# Patient Record
Sex: Female | Born: 1980 | Race: White | Hispanic: No | Smoking: Current every day smoker
Health system: Southern US, Community
[De-identification: ages and names within clinical notes are randomized; demographics above are authoritative.]

## PROBLEM LIST (undated history)

## (undated) DIAGNOSIS — D649 Anemia, unspecified: Secondary | ICD-10-CM

## (undated) DIAGNOSIS — R110 Nausea: Secondary | ICD-10-CM

## (undated) DIAGNOSIS — N643 Galactorrhea not associated with childbirth: Secondary | ICD-10-CM

## (undated) DIAGNOSIS — R87619 Unspecified abnormal cytological findings in specimens from cervix uteri: Secondary | ICD-10-CM

## (undated) DIAGNOSIS — F329 Major depressive disorder, single episode, unspecified: Secondary | ICD-10-CM

## (undated) DIAGNOSIS — D352 Benign neoplasm of pituitary gland: Secondary | ICD-10-CM

## (undated) DIAGNOSIS — IMO0002 Reserved for concepts with insufficient information to code with codable children: Secondary | ICD-10-CM

## (undated) DIAGNOSIS — F319 Bipolar disorder, unspecified: Secondary | ICD-10-CM

## (undated) DIAGNOSIS — G43909 Migraine, unspecified, not intractable, without status migrainosus: Secondary | ICD-10-CM

## (undated) DIAGNOSIS — F32A Depression, unspecified: Secondary | ICD-10-CM

## (undated) HISTORY — DX: Major depressive disorder, single episode, unspecified: F32.9

## (undated) HISTORY — PX: LEEP: SHX91

## (undated) HISTORY — DX: Galactorrhea not associated with childbirth: N64.3

## (undated) HISTORY — DX: Anemia, unspecified: D64.9

## (undated) HISTORY — PX: APPENDECTOMY: SHX54

## (undated) HISTORY — DX: Benign neoplasm of pituitary gland: D35.2

## (undated) HISTORY — DX: Reserved for concepts with insufficient information to code with codable children: IMO0002

## (undated) HISTORY — PX: TUBAL LIGATION: SHX77

## (undated) HISTORY — DX: Depression, unspecified: F32.A

## (undated) HISTORY — DX: Unspecified abnormal cytological findings in specimens from cervix uteri: R87.619

## (undated) HISTORY — DX: Nausea: R11.0

## (undated) HISTORY — DX: Bipolar disorder, unspecified: F31.9

## (undated) HISTORY — DX: Migraine, unspecified, not intractable, without status migrainosus: G43.909

---
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
## 1999-08-17 ENCOUNTER — Inpatient Hospital Stay (HOSPITAL_COMMUNITY): Admission: EM | Admit: 1999-08-17 | Discharge: 1999-08-19 | Payer: Self-pay | Admitting: *Deleted

## 1999-09-13 ENCOUNTER — Emergency Department (HOSPITAL_COMMUNITY): Admission: EM | Admit: 1999-09-13 | Discharge: 1999-09-13 | Payer: Self-pay | Admitting: Emergency Medicine

## 2000-02-25 ENCOUNTER — Emergency Department (HOSPITAL_COMMUNITY): Admission: EM | Admit: 2000-02-25 | Discharge: 2000-02-25 | Payer: Self-pay | Admitting: Emergency Medicine

## 2000-03-07 ENCOUNTER — Encounter: Payer: Self-pay | Admitting: Emergency Medicine

## 2000-03-07 ENCOUNTER — Emergency Department (HOSPITAL_COMMUNITY): Admission: EM | Admit: 2000-03-07 | Discharge: 2000-03-07 | Payer: Self-pay | Admitting: Emergency Medicine

## 2000-05-12 ENCOUNTER — Inpatient Hospital Stay (HOSPITAL_COMMUNITY): Admission: AD | Admit: 2000-05-12 | Discharge: 2000-05-12 | Payer: Self-pay | Admitting: Obstetrics & Gynecology

## 2000-05-12 ENCOUNTER — Encounter: Payer: Self-pay | Admitting: Obstetrics & Gynecology

## 2000-06-02 ENCOUNTER — Encounter: Admission: RE | Admit: 2000-06-02 | Discharge: 2000-06-02 | Payer: Self-pay | Admitting: Obstetrics

## 2003-06-06 ENCOUNTER — Emergency Department (HOSPITAL_COMMUNITY): Admission: EM | Admit: 2003-06-06 | Discharge: 2003-06-06 | Payer: Self-pay | Admitting: Emergency Medicine

## 2003-12-17 ENCOUNTER — Observation Stay (HOSPITAL_COMMUNITY): Admission: AD | Admit: 2003-12-17 | Discharge: 2003-12-18 | Payer: Self-pay | Admitting: Obstetrics & Gynecology

## 2004-01-23 ENCOUNTER — Ambulatory Visit (HOSPITAL_COMMUNITY): Admission: AD | Admit: 2004-01-23 | Discharge: 2004-01-23 | Payer: Self-pay | Admitting: Obstetrics & Gynecology

## 2004-01-25 ENCOUNTER — Ambulatory Visit: Payer: Self-pay | Admitting: *Deleted

## 2004-01-25 ENCOUNTER — Inpatient Hospital Stay (HOSPITAL_COMMUNITY): Admission: AD | Admit: 2004-01-25 | Discharge: 2004-01-28 | Payer: Self-pay | Admitting: Obstetrics and Gynecology

## 2012-01-25 DIAGNOSIS — F411 Generalized anxiety disorder: Secondary | ICD-10-CM | POA: Insufficient documentation

## 2012-08-10 DIAGNOSIS — E221 Hyperprolactinemia: Secondary | ICD-10-CM | POA: Insufficient documentation

## 2012-10-24 ENCOUNTER — Encounter: Payer: Self-pay | Admitting: *Deleted

## 2012-11-08 ENCOUNTER — Ambulatory Visit (INDEPENDENT_AMBULATORY_CARE_PROVIDER_SITE_OTHER): Payer: Medicaid Other | Admitting: Obstetrics & Gynecology

## 2012-11-08 ENCOUNTER — Telehealth: Payer: Self-pay | Admitting: *Deleted

## 2012-11-08 ENCOUNTER — Encounter: Payer: Self-pay | Admitting: Obstetrics & Gynecology

## 2012-11-08 VITALS — BP 120/80 | Ht 64.0 in | Wt 120.0 lb

## 2012-11-08 DIAGNOSIS — Z87410 Personal history of cervical dysplasia: Secondary | ICD-10-CM

## 2012-11-08 DIAGNOSIS — N760 Acute vaginitis: Secondary | ICD-10-CM

## 2012-11-08 DIAGNOSIS — Z01419 Encounter for gynecological examination (general) (routine) without abnormal findings: Secondary | ICD-10-CM

## 2012-11-08 DIAGNOSIS — Z0000 Encounter for general adult medical examination without abnormal findings: Secondary | ICD-10-CM

## 2012-11-08 MED ORDER — METRONIDAZOLE 0.75 % VA GEL: 1.0000 | VAGINAL | Status: DC

## 2012-11-08 NOTE — Telephone Encounter (Signed)
Dr Emelda Fear was contacted and oral Metronidazole was prescribed

## 2012-11-08 NOTE — Patient Instructions (Signed)
Bacterial Vaginosis Bacterial vaginosis (BV) is a vaginal infection where the normal balance of bacteria in the vagina is disrupted. The normal balance is then replaced by an overgrowth of certain bacteria. There are several different kinds of bacteria that can cause BV. BV is the most common vaginal infection in women of childbearing age. CAUSES   The cause of BV is not fully understood. BV develops when there is an increase or imbalance of harmful bacteria.  Some activities or behaviors can upset the normal balance of bacteria in the vagina and put women at increased risk including:  Having a new sex partner or multiple sex partners.  Douching.  Using an intrauterine device (IUD) for contraception.  It is not clear what role sexual activity plays in the development of BV. However, women that have never had sexual intercourse are rarely infected with BV. Women do not get BV from toilet seats, bedding, swimming pools or from touching objects around them.  SYMPTOMS   Grey vaginal discharge.  A fish-like odor with discharge, especially after sexual intercourse.  Itching or burning of the vagina and vulva.  Burning or pain with urination.  Some women have no signs or symptoms at all. DIAGNOSIS  Your caregiver must examine the vagina for signs of BV. Your caregiver will perform lab tests and look at the sample of vaginal fluid through a microscope. They will look for bacteria and abnormal cells (clue cells), a pH test higher than 4.5, and a positive amine test all associated with BV.  RISKS AND COMPLICATIONS   Pelvic inflammatory disease (PID).  Infections following gynecology surgery.  Developing HIV.  Developing herpes virus. TREATMENT  Sometimes BV will clear up without treatment. However, all women with symptoms of BV should be treated to avoid complications, especially if gynecology surgery is planned. Female partners generally do not need to be treated. However, BV may spread  between female sex partners so treatment is helpful in preventing a recurrence of BV.   BV may be treated with antibiotics. The antibiotics come in either pill or vaginal cream forms. Either can be used with nonpregnant or pregnant women, but the recommended dosages differ. These antibiotics are not harmful to the baby.  BV can recur after treatment. If this happens, a second round of antibiotics will often be prescribed.  Treatment is important for pregnant women. If not treated, BV can cause a premature delivery, especially for a pregnant woman who had a premature birth in the past. All pregnant women who have symptoms of BV should be checked and treated.  For chronic reoccurrence of BV, treatment with a type of prescribed gel vaginally twice a week is helpful. HOME CARE INSTRUCTIONS   Finish all medication as directed by your caregiver.  Do not have sex until treatment is completed.  Tell your sexual partner that you have a vaginal infection. They should see their caregiver and be treated if they have problems, such as a mild rash or itching.  Practice safe sex. Use condoms. Only have 1 sex partner. PREVENTION  Basic prevention steps can help reduce the risk of upsetting the natural balance of bacteria in the vagina and developing BV:  Do not have sexual intercourse (be abstinent).  Do not douche.  Use all of the medicine prescribed for treatment of BV, even if the signs and symptoms go away.  Tell your sex partner if you have BV. That way, they can be treated, if needed, to prevent reoccurrence. SEEK MEDICAL CARE IF:     Your symptoms are not improving after 3 days of treatment.  You have increased discharge, pain, or fever. MAKE SURE YOU:   Understand these instructions.  Will watch your condition.  Will get help right away if you are not doing well or get worse. FOR MORE INFORMATION  Division of STD Prevention (DSTDP), Centers for Disease Control and Prevention:  www.cdc.gov/std American Social Health Association (ASHA): www.ashastd.org  Document Released: 05/03/2005 Document Revised: 07/26/2011 Document Reviewed: 10/24/2008 Exit[employer] Patient Information 2014 [employer].  

## 2012-11-08 NOTE — Telephone Encounter (Signed)
Unable to speak with pt, mailbox full. JSY

## 2012-11-08 NOTE — Progress Notes (Signed)
Phone call thru call-a nurse. Pt cannot get Metrogel thru medicaid. Rx authorized for metronidazole 500 bid x 7 days.

## 2012-11-08 NOTE — Progress Notes (Signed)
Patient ID: Kathryn Mcneil, female   DOB: [birth year], 32 y.o.   MRN: [id number] Subjective:     Kathryn Mcneil is a 32 y.o. female here for a routine exam.  Patient's last menstrual period was 10/20/2012. No obstetric history on file. Current complaints: none.  Personal health questionnaire reviewed: no.   Gynecologic History Patient's last menstrual period was 10/20/2012. Contraception: tubal ligation Last Pap: 2014. Results were: abnormal and had colposcopy and the LEEP Last mammogram: na. Results were: na  Obstetric History OB History   Grav Para Term Preterm Abortions TAB SAB Ect Mult Living                   The following portions of the patient's history were reviewed and updated as appropriate: allergies, current medications, past family history, past medical history, past social history, past surgical history and problem list.  Review of Systems  Review of Systems  Constitutional: Negative for fever, chills, weight loss, malaise/fatigue and diaphoresis.  HENT: Negative for hearing loss, ear pain, nosebleeds, congestion, sore throat, neck pain, tinnitus and ear discharge.   Eyes: Negative for blurred vision, double vision, photophobia, pain, discharge and redness.  Respiratory: Negative for cough, hemoptysis, sputum production, shortness of breath, wheezing and stridor.   Cardiovascular: Negative for chest pain, palpitations, orthopnea, claudication, leg swelling and PND.  Gastrointestinal: negative for abdominal pain. Negative for heartburn, nausea, vomiting, diarrhea, constipation, blood in stool and melena.  Genitourinary: Negative for dysuria, urgency, frequency, hematuria and flank pain.  Musculoskeletal: Negative for myalgias, back pain, joint pain and falls.  Skin: Negative for itching and rash.  Neurological: Negative for dizziness, tingling, tremors, sensory change, speech change, focal weakness, seizures, loss of consciousness, weakness and headaches.   Endo/Heme/Allergies: Negative for environmental allergies and polydipsia. Does not bruise/bleed easily.  Psychiatric/Behavioral: Negative for depression, suicidal ideas, hallucinations, memory loss and substance abuse. The patient is not nervous/anxious and does not have insomnia.        Objective:    Physical Exam  Vitals reviewed. Constitutional: She is oriented to person, place, and time. She appears well-developed and well-nourished.  HENT:  Head: Normocephalic and atraumatic.        Right Ear: External ear normal.  Left Ear: External ear normal.  Nose: Nose normal.  Mouth/Throat: Oropharynx is clear and moist.  Eyes: Conjunctivae and EOM are normal. Pupils are equal, round, and reactive to light. Right eye exhibits no discharge. Left eye exhibits no discharge. No scleral icterus.  Neck: Normal range of motion. Neck supple. No tracheal deviation present. No thyromegaly present.  Cardiovascular: Normal rate, regular rhythm, normal heart sounds and intact distal pulses.  Exam reveals no gallop and no friction rub.   No murmur heard. Respiratory: Effort normal and breath sounds normal. No respiratory distress. She has no wheezes. She has no rales. She exhibits no tenderness.  GI: Soft. Bowel sounds are normal. She exhibits no distension and no mass. There is no tenderness. There is no rebound and no guarding.  Genitourinary:       Vulva is normal without lesions Vagina is pink moist with significan discharge, wet prep positive for BV no trichomonas and no yeast Cervix normal in appearance and pap is done Uterus is normal size shape and contour Adnexa is negative with normal sized ovaries   Musculoskeletal: Normal range of motion. She exhibits no edema and no tenderness.  Neurological: She is alert and oriented to person, place, and time. She has normal reflexes. She displays normal  reflexes. No cranial nerve deficit. She exhibits normal muscle tone. Coordination normal.  Skin: Skin  is warm and dry. No rash noted. No erythema. No pallor.  Psychiatric: She has a normal mood and affect. Her behavior is normal. Judgment and thought content normal.       Assessment:    Healthy female exam.    Plan:    Follow up in: 1 year. Metro Gel for BV

## 2012-11-09 NOTE — Telephone Encounter (Signed)
Metronidazole 500mg  BID x 7 days has been sent to [facility] in [place]. Pt aware. JSY

## 2012-12-11 ENCOUNTER — Telehealth: Payer: Self-pay | Admitting: Obstetrics and Gynecology

## 2012-12-12 ENCOUNTER — Telehealth: Payer: Self-pay | Admitting: Adult Health

## 2012-12-12 NOTE — Telephone Encounter (Signed)
Pt aware of results and to make an appointment with Dr. Despina Hidden for a colposcopy. Pt verbalized understanding. Brochures on abnormal pap and HPV sent to patient.

## 2013-01-02 ENCOUNTER — Encounter: Payer: Self-pay | Admitting: Obstetrics & Gynecology

## 2013-01-02 ENCOUNTER — Other Ambulatory Visit: Payer: Self-pay | Admitting: Obstetrics & Gynecology

## 2013-01-02 ENCOUNTER — Ambulatory Visit (INDEPENDENT_AMBULATORY_CARE_PROVIDER_SITE_OTHER): Payer: Medicaid Other | Admitting: Obstetrics & Gynecology

## 2013-01-02 VITALS — BP 100/60 | Ht 64.0 in | Wt 119.0 lb

## 2013-01-02 DIAGNOSIS — Z87410 Personal history of cervical dysplasia: Secondary | ICD-10-CM

## 2013-01-02 NOTE — Progress Notes (Signed)
Patient ID: Kathryn Mcneil, female   DOB: 01/03/1981, 32 y.o.   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

## 2013-01-09 ENCOUNTER — Ambulatory Visit: Payer: Medicaid Other | Admitting: Obstetrics & Gynecology

## 2013-01-10 ENCOUNTER — Ambulatory Visit (INDEPENDENT_AMBULATORY_CARE_PROVIDER_SITE_OTHER): Payer: Medicaid Other | Admitting: Obstetrics & Gynecology

## 2013-01-10 ENCOUNTER — Encounter: Payer: Self-pay | Admitting: Obstetrics & Gynecology

## 2013-01-10 VITALS — BP 100/60 | Wt 121.0 lb

## 2013-01-10 DIAGNOSIS — N870 Mild cervical dysplasia: Secondary | ICD-10-CM

## 2013-01-10 DIAGNOSIS — Z87410 Personal history of cervical dysplasia: Secondary | ICD-10-CM

## 2013-01-10 NOTE — Progress Notes (Signed)
Patient ID: [name], female   DOB: [birth year], 32 y.o.   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

## 2014-01-08 ENCOUNTER — Ambulatory Visit (INDEPENDENT_AMBULATORY_CARE_PROVIDER_SITE_OTHER): Payer: Medicare Other | Admitting: Obstetrics & Gynecology

## 2014-01-08 ENCOUNTER — Other Ambulatory Visit (HOSPITAL_COMMUNITY)
Admission: RE | Admit: 2014-01-08 | Discharge: 2014-01-08 | Disposition: A | Discharge status: Home / Self Care | Payer: Medicare Other | Source: Ambulatory Visit | Attending: Obstetrics & Gynecology | Admitting: Obstetrics & Gynecology

## 2014-01-08 ENCOUNTER — Encounter: Payer: Self-pay | Admitting: Obstetrics & Gynecology

## 2014-01-08 VITALS — BP 106/78 | Ht 64.0 in | Wt 132.0 lb

## 2014-01-08 DIAGNOSIS — R87810 Cervical high risk human papillomavirus (HPV) DNA test positive: Secondary | ICD-10-CM | POA: Insufficient documentation

## 2014-01-08 DIAGNOSIS — Z3202 Encounter for pregnancy test, result negative: Secondary | ICD-10-CM

## 2014-01-08 DIAGNOSIS — Z124 Encounter for screening for malignant neoplasm of cervix: Secondary | ICD-10-CM

## 2014-01-08 DIAGNOSIS — Z01419 Encounter for gynecological examination (general) (routine) without abnormal findings: Secondary | ICD-10-CM

## 2014-01-08 DIAGNOSIS — Z7251 High risk heterosexual behavior: Secondary | ICD-10-CM

## 2014-01-08 DIAGNOSIS — Z113 Encounter for screening for infections with a predominantly sexual mode of transmission: Secondary | ICD-10-CM | POA: Insufficient documentation

## 2014-01-08 DIAGNOSIS — Z1151 Encounter for screening for human papillomavirus (HPV): Secondary | ICD-10-CM | POA: Insufficient documentation

## 2014-01-08 LAB — POCT URINE PREGNANCY: PREG TEST UR: NEGATIVE

## 2014-01-08 NOTE — Progress Notes (Signed)
Patient ID: Kathryn Mcneil, female   DOB: [birth year], 33 y.o.   MRN: [id number] Subjective:     Kathryn Mcneil is a 33 y.o. female here for a routine exam.  Patient's last menstrual period was 11/17/2013. No obstetric history on file. Birth Control Method:  BTL  Menstrual Calendar(currently): skipped last month  Current complaints: none.   Current acute medical issues:  none   Recent Gynecologic History Patient's last menstrual period was 11/17/2013. Last Pap: 2014,  ASCUS normal colpo Last mammogram: ,    Past Medical History  Diagnosis Date  . Abnormal Pap smear   . Benign neoplasm of pituitary gland   . Galactorrhea   . Migraine     without aura  . Nausea   . Depression   . Anemia   . Bipolar 1 disorder, depressed     biopolar  . Ulcer     Past Surgical History  Procedure Laterality Date  . Appendectomy    . Tubal ligation    . Leep      leep    OB History   Grav Para Term Preterm Abortions TAB SAB Ect Mult Living                  History   Social History  . Marital Status: Single    Spouse Name: N/A    Number of Children: N/A  . Years of Education: N/A   Social History Main Topics  . Smoking status: Current Every Day Smoker -- 1.00 packs/day    Types: Cigarettes  . Smokeless tobacco: Never Used     Comment: 1 pkg a day.  . Alcohol Use: No  . Drug Use: No  . Sexual Activity: Yes    Birth Control/ Protection: Surgical   Other Topics Concern  . None   Social History Narrative  . None    Family History  Problem Relation Age of Onset  . Hypertension Mother   . Ulcers Mother     gastric  . Diabetes Maternal Grandmother   . Cancer Maternal Grandfather      Review of Systems  Review of Systems  Constitutional: Negative for fever, chills, weight loss, malaise/fatigue and diaphoresis.  HENT: Negative for hearing loss, ear pain, nosebleeds, congestion, sore throat, neck pain, tinnitus and ear discharge.   Eyes: Negative for blurred vision,  double vision, photophobia, pain, discharge and redness.  Respiratory: Negative for cough, hemoptysis, sputum production, shortness of breath, wheezing and stridor.   Cardiovascular: Negative for chest pain, palpitations, orthopnea, claudication, leg swelling and PND.  Gastrointestinal: negative for abdominal pain. Negative for heartburn, nausea, vomiting, diarrhea, constipation, blood in stool and melena.  Genitourinary: Negative for dysuria, urgency, frequency, hematuria and flank pain.  Musculoskeletal: Negative for myalgias, back pain, joint pain and falls.  Skin: Negative for itching and rash.  Neurological: Negative for dizziness, tingling, tremors, sensory change, speech change, focal weakness, seizures, loss of consciousness, weakness and headaches.  Endo/Heme/Allergies: Negative for environmental allergies and polydipsia. Does not bruise/bleed easily.  Psychiatric/Behavioral: Negative for depression, suicidal ideas, hallucinations, memory loss and substance abuse. The patient is not nervous/anxious and does not have insomnia.        Objective:    Physical Exam  Vitals reviewed. Constitutional: She is oriented to person, place, and time. She appears well-developed and well-nourished.  HENT:  Head: Normocephalic and atraumatic.        Right Ear: External ear normal.  Left Ear: External ear normal.  Nose: Nose normal.  Mouth/Throat: Oropharynx is clear and moist.  Eyes: Conjunctivae and EOM are normal. Pupils are equal, round, and reactive to light. Right eye exhibits no discharge. Left eye exhibits no discharge. No scleral icterus.  Neck: Normal range of motion. Neck supple. No tracheal deviation present. No thyromegaly present.  Cardiovascular: Normal rate, regular rhythm, normal heart sounds and intact distal pulses.  Exam reveals no gallop and no friction rub.   No murmur heard. Respiratory: Effort normal and breath sounds normal. No respiratory distress. She has no wheezes. She  has no rales. She exhibits no tenderness.  GI: Soft. Bowel sounds are normal. She exhibits no distension and no mass. There is no tenderness. There is no rebound and no guarding.  Genitourinary:  Breasts no masses skin changes or nipple changes bilaterally      Vulva is normal without lesions Vagina is pink moist without discharge Cervix normal in appearance and pap is done Uterus is normal size shape and contour Adnexa is negative with normal sized ovaries   Musculoskeletal: Normal range of motion. She exhibits no edema and no tenderness.  Neurological: She is alert and oriented to person, place, and time. She has normal reflexes. She displays normal reflexes. No cranial nerve deficit. She exhibits normal muscle tone. Coordination normal.  Skin: Skin is warm and dry. No rash noted. No erythema. No pallor.  Psychiatric: She has a normal mood and affect. Her behavior is normal. Judgment and thought content normal.       Assessment:    Healthy female exam.    Plan:    Follow up in: 1 year. wants some STD

## 2014-01-09 LAB — HEPATITIS C ANTIBODY: HCV Ab: NEGATIVE

## 2014-01-10 LAB — CYTOLOGY - PAP

## 2014-11-27 ENCOUNTER — Encounter: Payer: Medicare Other | Admitting: Adult Health

## 2015-05-01 ENCOUNTER — Encounter (HOSPITAL_COMMUNITY): Payer: Self-pay | Admitting: Emergency Medicine

## 2015-05-01 ENCOUNTER — Emergency Department (HOSPITAL_COMMUNITY): Payer: Medicare Other

## 2015-05-01 ENCOUNTER — Emergency Department (HOSPITAL_COMMUNITY)
Admission: EM | Admit: 2015-05-01 | Discharge: 2015-05-01 | Disposition: A | Discharge status: Home / Self Care | Payer: Medicare Other | Attending: Emergency Medicine | Admitting: Emergency Medicine

## 2015-05-01 DIAGNOSIS — Z3202 Encounter for pregnancy test, result negative: Secondary | ICD-10-CM | POA: Diagnosis not present

## 2015-05-01 DIAGNOSIS — Z79899 Other long term (current) drug therapy: Secondary | ICD-10-CM | POA: Insufficient documentation

## 2015-05-01 DIAGNOSIS — F319 Bipolar disorder, unspecified: Secondary | ICD-10-CM | POA: Diagnosis not present

## 2015-05-01 DIAGNOSIS — Z862 Personal history of diseases of the blood and blood-forming organs and certain disorders involving the immune mechanism: Secondary | ICD-10-CM | POA: Insufficient documentation

## 2015-05-01 DIAGNOSIS — N730 Acute parametritis and pelvic cellulitis: Secondary | ICD-10-CM | POA: Diagnosis not present

## 2015-05-01 DIAGNOSIS — Z86018 Personal history of other benign neoplasm: Secondary | ICD-10-CM | POA: Diagnosis not present

## 2015-05-01 DIAGNOSIS — G43909 Migraine, unspecified, not intractable, without status migrainosus: Secondary | ICD-10-CM | POA: Insufficient documentation

## 2015-05-01 DIAGNOSIS — Z8742 Personal history of other diseases of the female genital tract: Secondary | ICD-10-CM | POA: Diagnosis not present

## 2015-05-01 DIAGNOSIS — R1031 Right lower quadrant pain: Secondary | ICD-10-CM | POA: Diagnosis present

## 2015-05-01 DIAGNOSIS — F17210 Nicotine dependence, cigarettes, uncomplicated: Secondary | ICD-10-CM | POA: Diagnosis not present

## 2015-05-01 DIAGNOSIS — Z872 Personal history of diseases of the skin and subcutaneous tissue: Secondary | ICD-10-CM | POA: Insufficient documentation

## 2015-05-01 LAB — URINALYSIS, ROUTINE W REFLEX MICROSCOPIC
Bilirubin Urine: NEGATIVE
GLUCOSE, UA: NEGATIVE mg/dL
Ketones, ur: 15 mg/dL — AB
NITRITE: POSITIVE — AB
PROTEIN: 30 mg/dL — AB
Specific Gravity, Urine: >1.03 — ABNORMAL HIGH (ref 1.005–1.030)
pH: 6 (ref 5.0–8.0)

## 2015-05-01 LAB — CBC WITH DIFFERENTIAL/PLATELET
Basophils Absolute: 0 10*3/uL (ref 0.0–0.1)
Basophils Relative: 0 %
Eosinophils Absolute: 0 10*3/uL (ref 0.0–0.7)
Eosinophils Relative: 0 %
HCT: 39.6 % (ref 36.0–46.0)
Hemoglobin: 13.7 g/dL (ref 12.0–15.0)
Lymphocytes Relative: 8 %
Lymphs Abs: 1.4 10*3/uL (ref 0.7–4.0)
MCH: 31.4 pg (ref 26.0–34.0)
MCHC: 34.6 g/dL (ref 30.0–36.0)
MCV: 90.6 fL (ref 78.0–100.0)
MONOS PCT: 5 %
Monocytes Absolute: 0.9 10*3/uL (ref 0.1–1.0)
Neutro Abs: 16.9 10*3/uL — ABNORMAL HIGH (ref 1.7–7.7)
Neutrophils Relative %: 87 %
PLATELETS: 236 10*3/uL (ref 150–400)
RBC: 4.37 MIL/uL (ref 3.87–5.11)
RDW: 14.9 % (ref 11.5–15.5)
WBC: 19.3 10*3/uL — ABNORMAL HIGH (ref 4.0–10.5)

## 2015-05-01 LAB — I-STAT BETA HCG BLOOD, ED (MC, WL, AP ONLY): I-stat hCG, quantitative: <5 m[IU]/mL (ref <5)

## 2015-05-01 LAB — COMPREHENSIVE METABOLIC PANEL
ALT: 10 U/L — AB (ref 14–54)
AST: 19 U/L (ref 15–41)
Albumin: 4.3 g/dL (ref 3.5–5.0)
Alkaline Phosphatase: 75 U/L (ref 38–126)
Anion gap: 11 (ref 5–15)
BUN: 11 mg/dL (ref 6–20)
CHLORIDE: 100 mmol/L — AB (ref 101–111)
CO2: 25 mmol/L (ref 22–32)
CREATININE: 0.68 mg/dL (ref 0.44–1.00)
Calcium: 9.6 mg/dL (ref 8.9–10.3)
GFR calc non Af Amer: >60 mL/min (ref >60)
Glucose, Bld: 112 mg/dL — ABNORMAL HIGH (ref 65–99)
POTASSIUM: 3.3 mmol/L — AB (ref 3.5–5.1)
SODIUM: 136 mmol/L (ref 135–145)
Total Bilirubin: 0.7 mg/dL (ref 0.3–1.2)
Total Protein: 8 g/dL (ref 6.5–8.1)

## 2015-05-01 LAB — WET PREP, GENITAL
SPERM: NONE SEEN
Trich, Wet Prep: NONE SEEN
YEAST WET PREP: NONE SEEN

## 2015-05-01 LAB — URINE MICROSCOPIC-ADD ON

## 2015-05-01 LAB — LIPASE, BLOOD: Lipase: 20 U/L (ref 11–51)

## 2015-05-01 LAB — PREGNANCY, URINE: Preg Test, Ur: NEGATIVE

## 2015-05-01 MED ORDER — HYDROCODONE-ACETAMINOPHEN 5-325 MG PO TABS: 1.0000 | ORAL_TABLET | Freq: Four times a day (QID) | ORAL | Status: DC | PRN

## 2015-05-01 MED ORDER — IOHEXOL 300 MG/ML  SOLN
100.0000 mL | Freq: Once | INTRAMUSCULAR | Status: AC | PRN
  Administered 2015-05-01: 100 mL via INTRAVENOUS

## 2015-05-01 MED ORDER — HYDROMORPHONE HCL 1 MG/ML IJ SOLN
1.0000 mg | Freq: Once | INTRAMUSCULAR | Status: AC
  Administered 2015-05-01: 1 mg via INTRAVENOUS
  Filled 2015-05-01: qty 1

## 2015-05-01 MED ORDER — DOXYCYCLINE HYCLATE 100 MG PO CAPS: ORAL_CAPSULE | ORAL | Status: DC

## 2015-05-01 MED ORDER — DEXTROSE 5 % IV SOLN
1.0000 g | Freq: Once | INTRAVENOUS | Status: AC
  Administered 2015-05-01: 1 g via INTRAVENOUS
  Filled 2015-05-01: qty 10

## 2015-05-01 MED ORDER — AZITHROMYCIN 250 MG PO TABS
1000.0000 mg | ORAL_TABLET | Freq: Once | ORAL | Status: AC
  Administered 2015-05-01: 1000 mg via ORAL
  Filled 2015-05-01: qty 4

## 2015-05-01 MED ORDER — ONDANSETRON HCL 4 MG/2ML IJ SOLN
4.0000 mg | Freq: Once | INTRAMUSCULAR | Status: AC
  Administered 2015-05-01: 4 mg via INTRAVENOUS
  Filled 2015-05-01: qty 2

## 2015-05-01 MED ORDER — HYDROMORPHONE HCL 1 MG/ML IJ SOLN
0.5000 mg | Freq: Once | INTRAMUSCULAR | Status: AC
  Administered 2015-05-01: 0.5 mg via INTRAVENOUS
  Filled 2015-05-01: qty 1

## 2015-05-01 MED ORDER — METRONIDAZOLE 500 MG PO TABS: ORAL_TABLET | ORAL | Status: DC

## 2015-05-01 MED ORDER — SODIUM CHLORIDE 0.9 % IV BOLUS (SEPSIS)
1000.0000 mL | Freq: Once | INTRAVENOUS | Status: AC
  Administered 2015-05-01: 1000 mL via INTRAVENOUS

## 2015-05-01 NOTE — Discharge Instructions (Signed)
Follow up with your ob-gyn md next week °

## 2015-05-01 NOTE — ED Provider Notes (Signed)
CSN: [id number]     Arrival date & time 05/01/15  1215 History  By signing my name below, I, Tula Nakayama, attest that this documentation has been prepared under the direction and in the presence of Milton Ferguson, MD.  Electronically Signed: Tula Nakayama, ED Scribe. 05/01/2015. 12:41 PM.  Chief Complaint  Patient presents with  . Abdominal Pain   Patient is a 34 y.o. female presenting with abdominal pain. The history is provided by the patient. No language interpreter was used.  Abdominal Pain Pain location:  LLQ and RLQ Pain quality: sharp   Pain severity:  Moderate Onset quality:  Gradual Duration:  1 day Timing:  Constant Progression:  Unchanged Chronicity:  New Associated symptoms: nausea   Associated symptoms: no chest pain, no cough, no diarrhea, no fatigue and no hematuria    HPI Comments: Kathryn Mcneil is a 34 y.o. female with a history of appendectomy who presents to the Emergency Department complaining of constant, moderate, sharp lower abdominal pain, worse on the right, that started yesterday. She states nausea as an associated symptom. Her pain becomes worse with lying flat. Pt's LMP started 12/8, which she describes as vaginal spotting and less heavy than normal. Pt denies a history of similar pain.    Past Medical History  Diagnosis Date  . Abnormal Pap smear   . Benign neoplasm of pituitary gland ([place])   . Galactorrhea   . Migraine     without aura  . Nausea   . Depression   . Anemia   . Bipolar 1 disorder, depressed ([place])     biopolar  . Ulcer    Past Surgical History  Procedure Laterality Date  . Appendectomy    . Tubal ligation    . Leep      leep   Family History  Problem Relation Age of Onset  . Hypertension Mother   . Ulcers Mother     gastric  . Diabetes Maternal Grandmother   . Cancer Maternal Grandfather    Social History  Substance Use Topics  . Smoking status: Current Every Day Smoker -- 1.00 packs/day    Types: Cigarettes   . Smokeless tobacco: Never Used     Comment: 1 pkg a day.  . Alcohol Use: No   OB History    No data available     Review of Systems  Constitutional: Negative for appetite change and fatigue.  HENT: Negative for congestion, ear discharge and sinus pressure.   Eyes: Negative for discharge.  Respiratory: Negative for cough.   Cardiovascular: Negative for chest pain.  Gastrointestinal: Positive for nausea and abdominal pain. Negative for diarrhea.  Genitourinary: Negative for frequency and hematuria.  Musculoskeletal: Negative for back pain.  Skin: Negative for rash.  Neurological: Negative for seizures and headaches.  Psychiatric/Behavioral: Negative for hallucinations.   Allergies  Review of patient's allergies indicates no known allergies.  Home Medications   Prior to Admission medications   Medication Sig Start Date End Date Taking? Authorizing Provider  divalproex (DEPAKOTE) 500 MG DR tablet Take 500 mg by mouth 3 (three) times daily.    Historical Provider, MD  DULoxetine (CYMBALTA) 30 MG capsule Take 30 mg by mouth daily.    Historical Provider, MD  gabapentin (NEURONTIN) 300 MG capsule Take 300 mg by mouth 3 (three) times daily.    Historical Provider, MD  medroxyPROGESTERone (PROVERA) 10 MG tablet Take 10 mg by mouth daily. Takes for 10 days every other month if does not have period  Historical Provider, MD  QUEtiapine (SEROQUEL) 100 MG tablet Take 100 mg by mouth at bedtime.    Historical Provider, MD   BP 108/85 mmHg  Pulse 108  Temp(Src) 97.4 F (36.3 C) (Oral)  Resp 16  Ht 5\' 4"  (1.626 m)  Wt 126 lb (57.153 kg)  BMI 21.62 kg/m2  SpO2 98%  LMP 04/24/2015 Physical Exam  Constitutional: She is oriented to person, place, and time. She appears well-developed.  HENT:  Head: Normocephalic.  Eyes: Conjunctivae and EOM are normal. No scleral icterus.  Neck: Neck supple. No thyromegaly present.  Cardiovascular: Normal rate and regular rhythm.  Exam reveals no  gallop and no friction rub.   No murmur heard. Pulmonary/Chest: No stridor. She has no wheezes. She has no rales. She exhibits no tenderness.  Abdominal: She exhibits no distension. There is tenderness. There is no rebound.  Moderate RLQ and LLQ pain  Genitourinary:  Pelvic exam. Patient had a discharge from her cervix. She had tenderness to her cervix and both adnexal areas consistent with PID  Musculoskeletal: Normal range of motion. She exhibits no edema.  Lymphadenopathy:    She has no cervical adenopathy.  Neurological: She is oriented to person, place, and time. She exhibits normal muscle tone. Coordination normal.  Skin: No rash noted. No erythema.  Psychiatric: She has a normal mood and affect. Her behavior is normal.  Nursing note and vitals reviewed.  ED Course  Procedures  DIAGNOSTIC STUDIES: Oxygen Saturation is 98% on RA, normal by my interpretation.    COORDINATION OF CARE: 12:44 PM Discussed treatment plan with pt which includes lab work. Pt agreed to plan.  Labs Review Labs Reviewed - No data to display  Imaging Review No results found. I have personally reviewed and evaluated these images and lab results as part of my medical decision-making.   EKG Interpretation None      MDM   Final diagnoses:  None    PID. Patient given Rocephin and Zithromax in the emergency department. Patient given prescriptions for Flagyl and doxycycline Vicodin and is to follow-up with OB/GYN next week.   The chart was scribed for me under my direct supervision.  I personally performed the history, physical, and medical decision making and all procedures in the evaluation of this patient.Milton Ferguson, MD 05/01/15 [phone]

## 2015-05-01 NOTE — ED Notes (Signed)
Pt reports lower abd pain since yesterday.  Pt reports that no position is comfortable. Pt has nausea with dry heaving, no diarrhea.  Pt denies urinary symptoms and odor/discharge.  Pt alert and oriented.

## 2015-05-02 LAB — GC/CHLAMYDIA PROBE AMP (~~LOC~~) NOT AT ARMC
CHLAMYDIA, DNA PROBE: NEGATIVE
Neisseria Gonorrhea: POSITIVE — AB

## 2015-05-03 LAB — URINE CULTURE

## 2015-05-05 ENCOUNTER — Telehealth (HOSPITAL_BASED_OUTPATIENT_CLINIC_OR_DEPARTMENT_OTHER): Payer: Self-pay | Admitting: Emergency Medicine

## 2015-07-25 ENCOUNTER — Emergency Department (HOSPITAL_COMMUNITY)
Admission: EM | Admit: 2015-07-25 | Discharge: 2015-07-25 | Disposition: A | Discharge status: Home / Self Care | Payer: Medicare Other

## 2015-07-25 NOTE — ED Notes (Signed)
PT called x 3 with no answer

## 2015-07-25 NOTE — ED Notes (Signed)
Called x2 for triage with no answer. 

## 2015-07-26 ENCOUNTER — Emergency Department (HOSPITAL_COMMUNITY)
Admission: EM | Admit: 2015-07-26 | Discharge: 2015-07-26 | Disposition: A | Discharge status: Home / Self Care | Payer: Medicare Other | Attending: Emergency Medicine | Admitting: Emergency Medicine

## 2015-07-26 ENCOUNTER — Emergency Department (HOSPITAL_COMMUNITY): Payer: Medicare Other

## 2015-07-26 ENCOUNTER — Encounter (HOSPITAL_COMMUNITY): Payer: Self-pay | Admitting: Emergency Medicine

## 2015-07-26 DIAGNOSIS — J029 Acute pharyngitis, unspecified: Secondary | ICD-10-CM | POA: Diagnosis present

## 2015-07-26 DIAGNOSIS — F17210 Nicotine dependence, cigarettes, uncomplicated: Secondary | ICD-10-CM | POA: Diagnosis not present

## 2015-07-26 DIAGNOSIS — F319 Bipolar disorder, unspecified: Secondary | ICD-10-CM | POA: Diagnosis not present

## 2015-07-26 DIAGNOSIS — F329 Major depressive disorder, single episode, unspecified: Secondary | ICD-10-CM | POA: Diagnosis not present

## 2015-07-26 DIAGNOSIS — J039 Acute tonsillitis, unspecified: Secondary | ICD-10-CM

## 2015-07-26 DIAGNOSIS — Z79899 Other long term (current) drug therapy: Secondary | ICD-10-CM | POA: Diagnosis not present

## 2015-07-26 DIAGNOSIS — J36 Peritonsillar abscess: Secondary | ICD-10-CM | POA: Diagnosis not present

## 2015-07-26 DIAGNOSIS — Z792 Long term (current) use of antibiotics: Secondary | ICD-10-CM | POA: Diagnosis not present

## 2015-07-26 LAB — CBC WITH DIFFERENTIAL/PLATELET
BASOS ABS: 0 10*3/uL (ref 0.0–0.1)
BASOS PCT: 0 %
EOS PCT: 1 %
Eosinophils Absolute: 0.1 10*3/uL (ref 0.0–0.7)
HCT: 40.1 % (ref 36.0–46.0)
Hemoglobin: 13.5 g/dL (ref 12.0–15.0)
Lymphocytes Relative: 18 %
Lymphs Abs: 2.4 10*3/uL (ref 0.7–4.0)
MCH: 30.7 pg (ref 26.0–34.0)
MCHC: 33.7 g/dL (ref 30.0–36.0)
MCV: 91.1 fL (ref 78.0–100.0)
MONO ABS: 1.1 10*3/uL — AB (ref 0.1–1.0)
Monocytes Relative: 8 %
Neutro Abs: 10 10*3/uL — ABNORMAL HIGH (ref 1.7–7.7)
Neutrophils Relative %: 73 %
PLATELETS: 275 10*3/uL (ref 150–400)
RBC: 4.4 MIL/uL (ref 3.87–5.11)
RDW: 15.4 % (ref 11.5–15.5)
WBC: 13.6 10*3/uL — ABNORMAL HIGH (ref 4.0–10.5)

## 2015-07-26 LAB — BASIC METABOLIC PANEL
ANION GAP: 7 (ref 5–15)
BUN: 9 mg/dL (ref 6–20)
CALCIUM: 8.6 mg/dL — AB (ref 8.9–10.3)
CO2: 26 mmol/L (ref 22–32)
CREATININE: 0.54 mg/dL (ref 0.44–1.00)
Chloride: 104 mmol/L (ref 101–111)
GLUCOSE: 107 mg/dL — AB (ref 65–99)
Potassium: 3.6 mmol/L (ref 3.5–5.1)
Sodium: 137 mmol/L (ref 135–145)

## 2015-07-26 LAB — RAPID STREP SCREEN (MED CTR MEBANE ONLY): STREPTOCOCCUS, GROUP A SCREEN (DIRECT): NEGATIVE

## 2015-07-26 MED ORDER — PREDNISONE 10 MG PO TABS
60.0000 mg | ORAL_TABLET | Freq: Once | ORAL | Status: AC
  Administered 2015-07-26: 60 mg via ORAL
  Filled 2015-07-26: qty 1

## 2015-07-26 MED ORDER — CLINDAMYCIN HCL 300 MG PO CAPS: 300.0000 mg | ORAL_CAPSULE | Freq: Four times a day (QID) | ORAL | Status: DC

## 2015-07-26 MED ORDER — CLINDAMYCIN PHOSPHATE 900 MG/50ML IV SOLN
900.0000 mg | Freq: Once | INTRAVENOUS | Status: AC
  Administered 2015-07-26: 900 mg via INTRAVENOUS
  Filled 2015-07-26: qty 50

## 2015-07-26 MED ORDER — HYDROCODONE-ACETAMINOPHEN 5-325 MG PO TABS
1.0000 | ORAL_TABLET | Freq: Once | ORAL | Status: AC
  Administered 2015-07-26: 1 via ORAL
  Filled 2015-07-26: qty 1

## 2015-07-26 MED ORDER — HYDROCODONE-ACETAMINOPHEN 5-325 MG PO TABS: 1.0000 | ORAL_TABLET | ORAL | Status: DC | PRN

## 2015-07-26 MED ORDER — SODIUM CHLORIDE 0.9 % IV BOLUS (SEPSIS)
1000.0000 mL | Freq: Once | INTRAVENOUS | Status: AC
  Administered 2015-07-26: 1000 mL via INTRAVENOUS

## 2015-07-26 MED ORDER — IOHEXOL 300 MG/ML  SOLN
75.0000 mL | Freq: Once | INTRAMUSCULAR | Status: AC | PRN
  Administered 2015-07-26: 75 mL via INTRAVENOUS

## 2015-07-26 MED ORDER — DEXAMETHASONE SODIUM PHOSPHATE 10 MG/ML IJ SOLN
10.0000 mg | Freq: Once | INTRAMUSCULAR | Status: AC
  Administered 2015-07-26: 10 mg via INTRAVENOUS
  Filled 2015-07-26: qty 1

## 2015-07-26 NOTE — ED Provider Notes (Signed)
CSN: [id number]     Arrival date & time 07/26/15  1433 History   First MD Initiated Contact with Patient 07/26/15 1544     Chief Complaint  Patient presents with  . Sore Throat     (Consider location/radiation/quality/duration/timing/severity/associated sxs/prior Treatment) The history is provided by the patient.   Kathryn Mcneil is a 35 y.o. female presenting with sore throat, sensation of swelling in her throat and a change in her voice since yesterday. She states she normally has a very "girlie" voice, but sounds much deep to her today.  She reports her daughter was ill with a viral sore throat last week (had a negative strep test performed) .  She denies fevers, chills, nasal congestion, cough, shortness of breath or chest pain.  She has taken chloroseptic throat lozenges without relief of pain.       Past Medical History  Diagnosis Date  . Abnormal Pap smear   . 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 Galactorrhea   . Migraine     without aura  . Nausea   . Depression   . Anemia   . Bipolar 1 disorder, depressed ([place])     biopolar  . Ulcer    Past Surgical History  Procedure Laterality Date  . Appendectomy    . Tubal ligation    . Leep      leep   Family History  Problem Relation Age of Onset  . Hypertension Mother   . Ulcers Mother     gastric  . Diabetes Maternal Grandmother   . Cancer Maternal Grandfather    Social History  Substance Use Topics  . Smoking status: Current Every Day Smoker -- 1.00 packs/day    Types: Cigarettes  . Smokeless tobacco: Never Used     Comment: 1 pkg a day.  . Alcohol Use: No   OB History    No data available     Review of Systems  Constitutional: Negative for fever and chills.  HENT: Positive for sore throat, trouble swallowing and voice change. Negative for congestion, ear pain, rhinorrhea and sinus pressure.   Eyes: Negative for discharge.  Respiratory: Negative for cough, shortness of breath, wheezing and  stridor.   Cardiovascular: Negative for chest pain.  Gastrointestinal: Negative for nausea and abdominal pain.  Genitourinary: Negative.   Musculoskeletal: Negative.       Allergies  Review of patient's allergies indicates no known allergies.  Home Medications   Prior to Admission medications   Medication Sig Start Date End Date Taking? Authorizing Provider  aspirin-acetaminophen-caffeine (EXCEDRIN MIGRAINE) [phone] MG tablet Take 2-4 tablets by mouth every 6 (six) hours as needed for headache.   Yes Historical Provider, MD  clindamycin (CLEOCIN) 300 MG capsule Take 1 capsule (300 mg total) by mouth every 6 (six) hours. 07/26/15   Evalee Jefferson, PA-C  doxycycline (VIBRAMYCIN) 100 MG capsule One po bid 05/01/15   Milton Ferguson, MD  HYDROcodone-acetaminophen (NORCO/VICODIN) 5-325 MG tablet Take 1 tablet by mouth every 4 (four) hours as needed. 07/26/15   Evalee Jefferson, PA-C  metroNIDAZOLE (FLAGYL) 500 MG tablet One po bid 05/01/15   Milton Ferguson, MD   BP 104/74 mmHg  Pulse 90  Temp(Src) 98.7 F (37.1 C) (Oral)  Resp 15  Ht 5\' 4"  (1.626 m)  Wt 58.968 kg  BMI 22.30 kg/m2  SpO2 100%  LMP 07/17/2015 Physical Exam  Constitutional: She is oriented to person, place, and time. She appears well-developed and well-nourished.  HENT:  Head: Normocephalic and atraumatic.  Right Ear: Tympanic membrane and ear canal normal.  Left Ear: Tympanic membrane and ear canal normal.  Nose: No mucosal edema or rhinorrhea.  Mouth/Throat: Uvula is midline and mucous membranes are normal. Oropharyngeal exudate and posterior oropharyngeal erythema present. No posterior oropharyngeal edema or tonsillar abscesses.  Small amount of white exudate left tonsil only.  Bilateral tonsils uniform in size, 2+. Voice is hoarse. No stridor.  Eyes: Conjunctivae are normal.  Neck: Normal range of motion. Tracheal tenderness present. No edema and no erythema present. No thyroid mass present.  Cardiovascular: Normal rate and  normal heart sounds.   Pulmonary/Chest: Effort normal. No stridor. No respiratory distress. She has no wheezes. She has no rales.  Musculoskeletal: Normal range of motion.  Lymphadenopathy:       Head (left side): Tonsillar adenopathy present.    She has cervical adenopathy.  Neurological: She is alert and oriented to person, place, and time.  Skin: Skin is warm and dry. No rash noted.  Psychiatric: She has a normal mood and affect.    ED Course  Procedures (including critical care time) Labs Review Labs Reviewed  CBC WITH DIFFERENTIAL/PLATELET - Abnormal; Notable for the following:    WBC 13.6 (*)    Neutro Abs 10.0 (*)    Monocytes Absolute 1.1 (*)    All other components within normal limits  BASIC METABOLIC PANEL - Abnormal; Notable for the following:    Glucose, Bld 107 (*)    Calcium 8.6 (*)    All other components within normal limits  RAPID STREP SCREEN (NOT AT [facility])  CULTURE, GROUP A STREP [facility])    Imaging Review Dg Neck Soft Tissue  07/26/2015  CLINICAL DATA:  Globus sensation. Her throat feels like it is closing up. EXAM: NECK SOFT TISSUES - 1+ VIEW COMPARISON:  None. FINDINGS: The trachea is deviated to the right on the frontal view. There is mild prominence of the retropharyngeal soft tissues on the lateral view. Epiglottis and aryepiglottic folds are normal. Airway appears somewhat narrowed on the lateral view but patent. IMPRESSION: Prominent retropharyngeal soft tissues on the lateral view with deviation of the trachea to the right on the frontal view. May consider further evaluation with neck CT with IV contrast. Electronically Signed   By: Rolm Baptise M.D.   On: 07/26/2015 16:25   Ct Soft Tissue Neck W Contrast  07/26/2015  CLINICAL DATA:  Sore throat since yesterday.  Globus sensation. EXAM: CT NECK WITH CONTRAST TECHNIQUE: Multidetector CT imaging of the neck was performed using the standard protocol following the bolus administration of intravenous contrast.  CONTRAST:  48mL OMNIPAQUE IOHEXOL 300 MG/ML  SOLN COMPARISON:  Neck radiographs earlier today FINDINGS: Pharynx and larynx: There is prominent low-density fluid in the retropharynx extending from C2 T C5. This measures up to approximately 1.3 cm in AP thickness in the midline at the C4 level. There is mild mass effect on the posterior pharyngeal wall, however well-defined rim enhancement is not identified. There is asymmetric intermediate density soft tissue in the left lateral aspect of the retropharynx which measures approximately 3 x 3 cm at the C3 level. This extends to the posterior aspect of the left tonsil which is mildly enlarged compared to the right. Inflammatory change is present in the left parapharyngeal space and in the left anterior neck. Salivary glands: Submandibular parotid glands are unremarkable. Thyroid: Unremarkable. Lymph nodes: A mildly prominent left level II lymph node measures 8 mm in short axis,  likely reactive. Subcentimeter level III-IV, and V lymph nodes are also likely reactive. Vascular: Major vascular structures of the neck appear patent. Specifically, there is no evidence of internal jugular vein thrombosis. Limited intracranial: The visualized portion of the brain is unremarkable. Visualized orbits: Unremarkable. Mastoids and visualized paranasal sinuses: Clear. Skeleton: Unremarkable. No CT evidence of cervical spine osteomyelitis or longus colli calcific tendinitis. Upper chest: Unremarkable. IMPRESSION: Prominent retropharyngeal fluid with 3 cm focal phlegmonous change in the left lateral retropharyngeal space, likely reflecting acute left-sided tonsillitis which has extended posteriorly. The retropharyngeal fluid may reflect effusion at this point given the lack of significant rim enhancement, however developing abscess is not excluded. These results were called by telephone at the time of interpretation on 07/26/2015 at 5:40 pm to [place], [place] , who verbally acknowledged  these results. Electronically Signed   By: Logan Bores M.D.   On: 07/26/2015 17:47   I have personally reviewed and evaluated these images and lab results as part of my medical decision-making.   EKG Interpretation None      MDM   Final diagnoses:  Tonsillitis, phlegmonous    Pt with suggestion of esophageal swelling on xray imaging,  Ct scan pending.  Labs pending.  She was given prednisone 60 mg po given tonsillar hypertrophy prior to imaging.    Discussed with Dr Alvino Chapel.  6:05 PM CT imaging back,  Labs back.  Discussed with Dr. Benjamine Mola, clindamycin 900 IV now, decadron 10 mg IV.  clindamycin 300 qid for home, f/u with Dr. Benjamine Mola this week,  Sooner here for any worsened sx.   Evalee Jefferson, PA-C 07/26/15 1805  Davonna Belling, MD 07/26/15 2047

## 2015-07-26 NOTE — ED Notes (Signed)
Patient c/o headache. Provider made aware

## 2015-07-26 NOTE — ED Notes (Signed)
PA at beside.

## 2015-07-26 NOTE — ED Notes (Signed)
Patient complaining of sore throat since yesterday.  

## 2015-07-26 NOTE — Discharge Instructions (Signed)

## 2015-07-30 LAB — CULTURE, GROUP A STREP (THRC)

## 2015-07-31 ENCOUNTER — Ambulatory Visit (INDEPENDENT_AMBULATORY_CARE_PROVIDER_SITE_OTHER): Payer: Medicare Other | Admitting: Otolaryngology

## 2015-07-31 DIAGNOSIS — J390 Retropharyngeal and parapharyngeal abscess: Secondary | ICD-10-CM | POA: Diagnosis not present

## 2016-02-02 ENCOUNTER — Inpatient Hospital Stay (HOSPITAL_COMMUNITY)
Admission: AD | Admit: 2016-02-02 | Payer: Medicare Other | Source: Ambulatory Visit | Admitting: Obstetrics & Gynecology

## 2017-02-04 ENCOUNTER — Encounter (HOSPITAL_BASED_OUTPATIENT_CLINIC_OR_DEPARTMENT_OTHER): Payer: Self-pay

## 2017-02-04 ENCOUNTER — Emergency Department (HOSPITAL_BASED_OUTPATIENT_CLINIC_OR_DEPARTMENT_OTHER)
Admission: EM | Admit: 2017-02-04 | Discharge: 2017-02-04 | Discharge status: Against Medical Advice | Payer: Medicare Other | Attending: Emergency Medicine | Admitting: Emergency Medicine

## 2017-02-04 DIAGNOSIS — Z79899 Other long term (current) drug therapy: Secondary | ICD-10-CM | POA: Insufficient documentation

## 2017-02-04 DIAGNOSIS — R1084 Generalized abdominal pain: Secondary | ICD-10-CM

## 2017-02-04 DIAGNOSIS — F17210 Nicotine dependence, cigarettes, uncomplicated: Secondary | ICD-10-CM | POA: Insufficient documentation

## 2017-02-04 DIAGNOSIS — R1032 Left lower quadrant pain: Secondary | ICD-10-CM | POA: Diagnosis present

## 2017-02-04 LAB — URINALYSIS, ROUTINE W REFLEX MICROSCOPIC
Bilirubin Urine: NEGATIVE
GLUCOSE, UA: NEGATIVE mg/dL
HGB URINE DIPSTICK: NEGATIVE
Ketones, ur: NEGATIVE mg/dL
Leukocytes, UA: NEGATIVE
Nitrite: NEGATIVE
PH: 6 (ref 5.0–8.0)
PROTEIN: NEGATIVE mg/dL
Specific Gravity, Urine: 1.01 (ref 1.005–1.030)

## 2017-02-04 LAB — COMPREHENSIVE METABOLIC PANEL
ALBUMIN: 4.4 g/dL (ref 3.5–5.0)
ALT: 12 U/L — ABNORMAL LOW (ref 14–54)
ANION GAP: 7 (ref 5–15)
AST: 18 U/L (ref 15–41)
Alkaline Phosphatase: 67 U/L (ref 38–126)
BILIRUBIN TOTAL: 0.6 mg/dL (ref 0.3–1.2)
BUN: 7 mg/dL (ref 6–20)
CALCIUM: 9.1 mg/dL (ref 8.9–10.3)
CO2: 27 mmol/L (ref 22–32)
Chloride: 105 mmol/L (ref 101–111)
Creatinine, Ser: 0.6 mg/dL (ref 0.44–1.00)
Glucose, Bld: 91 mg/dL (ref 65–99)
POTASSIUM: 4.1 mmol/L (ref 3.5–5.1)
Sodium: 139 mmol/L (ref 135–145)
TOTAL PROTEIN: 7.7 g/dL (ref 6.5–8.1)

## 2017-02-04 LAB — CBC WITH DIFFERENTIAL/PLATELET
BASOS PCT: 0 %
Basophils Absolute: 0 10*3/uL (ref 0.0–0.1)
Eosinophils Absolute: 0 10*3/uL (ref 0.0–0.7)
Eosinophils Relative: 0 %
HCT: 39.9 % (ref 36.0–46.0)
Hemoglobin: 13.9 g/dL (ref 12.0–15.0)
LYMPHS ABS: 1.9 10*3/uL (ref 0.7–4.0)
Lymphocytes Relative: 19 %
MCH: 32.1 pg (ref 26.0–34.0)
MCHC: 34.8 g/dL (ref 30.0–36.0)
MCV: 92.1 fL (ref 78.0–100.0)
MONO ABS: 0.5 10*3/uL (ref 0.1–1.0)
MONOS PCT: 5 %
NEUTROS ABS: 7.5 10*3/uL (ref 1.7–7.7)
Neutrophils Relative %: 76 %
Platelets: 231 10*3/uL (ref 150–400)
RBC: 4.33 MIL/uL (ref 3.87–5.11)
RDW: 15.1 % (ref 11.5–15.5)
WBC: 9.9 10*3/uL (ref 4.0–10.5)

## 2017-02-04 LAB — PREGNANCY, URINE: Preg Test, Ur: NEGATIVE

## 2017-02-04 NOTE — ED Provider Notes (Signed)
[place] DEPT [facility] Provider Note   CSN: [id number] Arrival date & time: 02/04/17  1315     History   Chief Complaint Chief Complaint  Patient presents with  . Abdominal Pain    HPI Kathryn Mcneil is a 36 y.o. female.  The history is provided by the patient. No language interpreter was used.  Abdominal Pain   This is a new problem. The current episode started more than 2 days ago. The problem occurs constantly. The problem has been gradually worsening. The pain is located in the LLQ. The pain is at a severity of 5/10. The pain is moderate. Nothing aggravates the symptoms. Nothing relieves the symptoms. Past workup includes surgery.    Past Medical History:  Diagnosis Date  . Abnormal Pap smear   . Anemia   . 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 Bipolar 1 disorder, depressed ([place])    biopolar  . Depression   . Galactorrhea   . Migraine    without aura  . Nausea   . Ulcer     Patient Active Problem List   Diagnosis Date Noted  . History of cervical dysplasia 11/08/2012    Past Surgical History:  Procedure Laterality Date  . APPENDECTOMY    . LEEP     leep  . TUBAL LIGATION      OB History    No data available       Home Medications    Prior to Admission medications   Medication Sig Start Date End Date Taking? Authorizing Provider  aspirin-acetaminophen-caffeine (EXCEDRIN MIGRAINE) [phone] MG tablet Take 2-4 tablets by mouth every 6 (six) hours as needed for headache.    [provider]  clindamycin (CLEOCIN) 300 MG capsule Take 1 capsule (300 mg total) by mouth every 6 (six) hours. 07/26/15   Evalee Jefferson, PA-C  doxycycline (VIBRAMYCIN) 100 MG capsule One po bid 05/01/15   Milton Ferguson, MD  HYDROcodone-acetaminophen (NORCO/VICODIN) 5-325 MG tablet Take 1 tablet by mouth every 4 (four) hours as needed. 07/26/15   Evalee Jefferson, PA-C  metroNIDAZOLE (FLAGYL) 500 MG tablet One po bid 05/01/15   Milton Ferguson, MD    Family  History Family History  Problem Relation Age of Onset  . Hypertension Mother   . Ulcers Mother        gastric  . Diabetes Maternal Grandmother   . Cancer Maternal Grandfather     Social History Social History  Substance Use Topics  . Smoking status: Current Every Day Smoker    Packs/day: 1.00    Types: Cigarettes  . Smokeless tobacco: Never Used     Comment: 1 pkg a day.  . Alcohol use No     Allergies   Patient has no known allergies.   Review of Systems Review of Systems  Gastrointestinal: Positive for abdominal pain.  All other systems reviewed and are negative.    Physical Exam Updated Vital Signs BP 105/75 (BP Location: Left Arm)   Pulse 91   Temp 97.9 F (36.6 C) (Oral)   Resp 16   LMP 12/24/2016   SpO2 100%   Physical Exam  Constitutional: She appears well-nourished.  HENT:  Head: Normocephalic.  Eyes: Pupils are equal, round, and reactive to light.  Cardiovascular: Normal rate.   Pulmonary/Chest: Effort normal.  Abdominal: Soft. There is tenderness.  Tender left lower quadrant   Musculoskeletal: She exhibits no tenderness.  Neurological: She is alert.  Skin: Skin is warm.  Psychiatric: She has a  normal mood and affect.  Nursing note and vitals reviewed.  Pt left before pelvic and before ultrasound   ED Treatments / Results  Labs (all labs ordered are listed, but only abnormal results are displayed) Labs Reviewed  COMPREHENSIVE METABOLIC PANEL - Abnormal; Notable for the following:       Result Value   ALT 12 (*)    All other components within normal limits  WET PREP, GENITAL  URINALYSIS, ROUTINE W REFLEX MICROSCOPIC  PREGNANCY, URINE  CBC WITH DIFFERENTIAL/PLATELET  GC/CHLAMYDIA PROBE AMP ([place]) NOT AT [facility]    EKG  EKG Interpretation None       Radiology No results found.  Procedures Procedures (including critical care time)  Medications Ordered in ED Medications - No data to display   Initial Impression /  Assessment and Plan / ED Course  I have reviewed the triage vital signs and the nursing notes.  Pertinent labs & imaging results that were available during my care of the patient were reviewed by me and considered in my medical decision making (see chart for details).       Final Clinical Impressions(s) / ED Diagnoses   Final diagnoses:  Generalized abdominal pain    New Prescriptions Discharge Medication List as of 02/04/2017  5:39 PM       Fransico Meadow, PA-C 02/04/17 Allen Norris, MD 02/04/17 2004

## 2017-02-04 NOTE — ED Notes (Signed)
Pt not in room, gown on bed.   

## 2017-02-04 NOTE — ED Triage Notes (Signed)
Pt reports left lower quadrant pain x 2 days. Nausea, no vomiting.

## 2017-04-23 ENCOUNTER — Encounter (HOSPITAL_BASED_OUTPATIENT_CLINIC_OR_DEPARTMENT_OTHER): Payer: Self-pay | Admitting: Emergency Medicine

## 2017-04-23 ENCOUNTER — Emergency Department (HOSPITAL_BASED_OUTPATIENT_CLINIC_OR_DEPARTMENT_OTHER)
Admission: EM | Admit: 2017-04-23 | Discharge: 2017-04-23 | Disposition: A | Discharge status: Home / Self Care | Payer: Medicare Other | Attending: Emergency Medicine | Admitting: Emergency Medicine

## 2017-04-23 ENCOUNTER — Other Ambulatory Visit: Payer: Self-pay

## 2017-04-23 ENCOUNTER — Emergency Department (HOSPITAL_BASED_OUTPATIENT_CLINIC_OR_DEPARTMENT_OTHER): Payer: Medicare Other

## 2017-04-23 DIAGNOSIS — Y9389 Activity, other specified: Secondary | ICD-10-CM | POA: Insufficient documentation

## 2017-04-23 DIAGNOSIS — S60021A Contusion of right index finger without damage to nail, initial encounter: Secondary | ICD-10-CM | POA: Diagnosis not present

## 2017-04-23 DIAGNOSIS — Y999 Unspecified external cause status: Secondary | ICD-10-CM | POA: Insufficient documentation

## 2017-04-23 DIAGNOSIS — Y929 Unspecified place or not applicable: Secondary | ICD-10-CM | POA: Insufficient documentation

## 2017-04-23 DIAGNOSIS — F17210 Nicotine dependence, cigarettes, uncomplicated: Secondary | ICD-10-CM | POA: Insufficient documentation

## 2017-04-23 DIAGNOSIS — W230XXA Caught, crushed, jammed, or pinched between moving objects, initial encounter: Secondary | ICD-10-CM | POA: Insufficient documentation

## 2017-04-23 DIAGNOSIS — S6981XA Other specified injuries of right wrist, hand and finger(s), initial encounter: Secondary | ICD-10-CM | POA: Diagnosis present

## 2017-04-23 DIAGNOSIS — Z79899 Other long term (current) drug therapy: Secondary | ICD-10-CM | POA: Insufficient documentation

## 2017-04-23 NOTE — ED Triage Notes (Signed)
Pt presents with c/o right hand index finger pain and swelling after an accident with a glass table top. No broken skin.

## 2017-04-23 NOTE — ED Provider Notes (Signed)
[place] EMERGENCY DEPARTMENT Provider Note   CSN: [id number] Arrival date & time: 04/23/17  2114     History   Chief Complaint Chief Complaint  Patient presents with  . Finger Injury    HPI Kathryn Mcneil is a 36 y.o. female.  36 yO F with a chief complaint of finger injury.  The patient was carrying a heavy plate of glass and dropped it on her finger.  Has had pain since then.  Worse with movement palpation.  Describes it as a throbbing pain.  Has not tried anything for this at home.  Denies break in the skin.   The history is provided by the patient.  Hand Injury   The incident occurred 3 to 5 hours ago. The incident occurred at work. The injury mechanism was compression. The pain is present in the right fingers. The quality of the pain is described as aching. The pain is at a severity of 9/10. The pain is moderate. The pain has been constant since the incident. Pertinent negatives include no fever. She reports no foreign bodies present. The symptoms are aggravated by movement, palpation and use. She has tried nothing for the symptoms. The treatment provided no relief.    Past Medical History:  Diagnosis Date  . Abnormal Pap smear   . Anemia   . 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 Bipolar 1 disorder, depressed ([place])    biopolar  . Depression   . Galactorrhea   . Migraine    without aura  . Nausea   . Ulcer     Patient Active Problem List   Diagnosis Date Noted  . History of cervical dysplasia 11/08/2012    Past Surgical History:  Procedure Laterality Date  . APPENDECTOMY    . LEEP     leep  . TUBAL LIGATION      OB History    No data available       Home Medications    Prior to Admission medications   Medication Sig Start Date End Date Taking? Authorizing Provider  aspirin-acetaminophen-caffeine (EXCEDRIN MIGRAINE) [phone] MG tablet Take 2-4 tablets by mouth every 6 (six) hours as needed for headache.    [provider]  clindamycin (CLEOCIN) 300 MG capsule Take 1 capsule (300 mg total) by mouth every 6 (six) hours. 07/26/15   Evalee Jefferson, PA-C  doxycycline (VIBRAMYCIN) 100 MG capsule One po bid 05/01/15   Milton Ferguson, MD  HYDROcodone-acetaminophen (NORCO/VICODIN) 5-325 MG tablet Take 1 tablet by mouth every 4 (four) hours as needed. 07/26/15   Evalee Jefferson, PA-C  metroNIDAZOLE (FLAGYL) 500 MG tablet One po bid 05/01/15   Milton Ferguson, MD    Family History Family History  Problem Relation Age of Onset  . Hypertension Mother   . Ulcers Mother        gastric  . Diabetes Maternal Grandmother   . Cancer Maternal Grandfather     Social History Social History   Tobacco Use  . Smoking status: Current Every Day Smoker    Packs/day: 1.00    Types: Cigarettes  . Smokeless tobacco: Never Used  . Tobacco comment: 1 pkg a day.  Substance Use Topics  . Alcohol use: No  . Drug use: No     Allergies   Patient has no known allergies.   Review of Systems Review of Systems  Constitutional: Negative for chills and fever.  HENT: Negative for congestion and rhinorrhea.   Eyes: Negative for redness  and visual disturbance.  Respiratory: Negative for shortness of breath and wheezing.   Cardiovascular: Negative for chest pain and palpitations.  Gastrointestinal: Negative for nausea and vomiting.  Genitourinary: Negative for dysuria and urgency.  Musculoskeletal: Positive for arthralgias. Negative for myalgias.  Skin: Negative for pallor and wound.  Neurological: Negative for dizziness and headaches.     Physical Exam Updated Vital Signs BP 117/87 (BP Location: Left Arm)   Pulse 88   Temp 98.8 F (37.1 C) (Oral)   Resp 20   Ht 5\' 4"  (1.626 m)   Wt 59 kg (130 lb)   LMP 04/18/2017   SpO2 100%   BMI 22.31 kg/m   Physical Exam  Constitutional: She is oriented to person, place, and time. She appears well-developed and well-nourished. No distress.  HENT:  Head: Normocephalic and  atraumatic.  Eyes: EOM are normal. Pupils are equal, round, and reactive to light.  Neck: Normal range of motion. Neck supple.  Cardiovascular: Normal rate and regular rhythm. Exam reveals no gallop and no friction rub.  No murmur heard. Pulmonary/Chest: Effort normal. She has no wheezes. She has no rales.  Abdominal: Soft. She exhibits no distension. There is no tenderness.  Musculoskeletal: She exhibits tenderness. She exhibits no edema.  Mild tenderness to the distal aspect of the right second digit.  Full range of motion.  Subjective decreased sensation to bilateral aspects of the finger.  Trace edema.  Neurological: She is alert and oriented to person, place, and time.  Skin: Skin is warm and dry. She is not diaphoretic.  Psychiatric: She has a normal mood and affect. Her behavior is normal.  Nursing note and vitals reviewed.    ED Treatments / Results  Labs (all labs ordered are listed, but only abnormal results are displayed) Labs Reviewed - No data to display  EKG  EKG Interpretation None       Radiology Dg Hand Complete Right  Result Date: 04/23/2017 CLINICAL DATA:  Signing room table fell on hand. EXAM: RIGHT HAND - COMPLETE 3+ VIEW COMPARISON:  None. FINDINGS: There is no evidence of fracture or dislocation. There is no evidence of arthropathy or other focal bone abnormality. Soft tissues are unremarkable. IMPRESSION: Negative. Electronically Signed   By: Elon Alas M.D.   On: 04/23/2017 22:14    Procedures Procedures (including critical care time)  Medications Ordered in ED Medications - No data to display   Initial Impression / Assessment and Plan / ED Course  I have reviewed the triage vital signs and the nursing notes.  Pertinent labs & imaging results that were available during my care of the patient were reviewed by me and considered in my medical decision making (see chart for details).     36 yo F with a blunt injury to the finger.  No broken  skin.  X-rays negative for fracture.  Full range of motion.  Discharge home.  11:43 PM:  I have discussed the diagnosis/risks/treatment options with the patient and family and believe the pt to be eligible for discharge home to follow-up with PCP. We also discussed returning to the ED immediately if new or worsening sx occur. We discussed the sx which are most concerning (e.g., sudden worsening pain, fever, inability to tolerate by mouth) that necessitate immediate return. Medications administered to the patient during their visit and any new prescriptions provided to the patient are listed below.  Medications given during this visit Medications - No data to display   The patient appears  reasonably screen and/or stabilized for discharge and I doubt any other medical condition or other [facility] requiring further screening, evaluation, or treatment in the ED at this time prior to discharge.    Final Clinical Impressions(s) / ED Diagnoses   Final diagnoses:  Contusion of right index finger without damage to nail, initial encounter    ED Discharge Orders    None       Deno Etienne, DO 04/23/17 2343

## 2017-04-23 NOTE — Discharge Instructions (Signed)
Take 4 over the counter ibuprofen tablets 3 times a day or 2 over-the-counter naproxen tablets twice a day for pain. Also take tylenol 1000mg(2 extra strength) four times a day.    

## 2017-05-28 IMAGING — CT CT NECK W/ CM
3 of 4 series · 13 of 33 positions shown, 16 images · IV contrast (Omnipaque 300)
Comparison: Neck radiographs earlier today

CLINICAL DATA: Sore throat since yesterday.  Globus sensation.

EXAM:
CT NECK WITH CONTRAST
TECHNIQUE: Multidetector CT imaging of the neck was performed using the
standard protocol following the bolus administration of intravenous
contrast.
CONTRAST:  75mL OMNIPAQUE IOHEXOL 300 MG/ML  SOLN

[Series 2: soft tissue neck 2.0 b31s · axial · 0.55mm/px · z∈[-127,+31]mm · 5 of 119 slices shown, 7 images]
[im 20/119  soft-tissue]
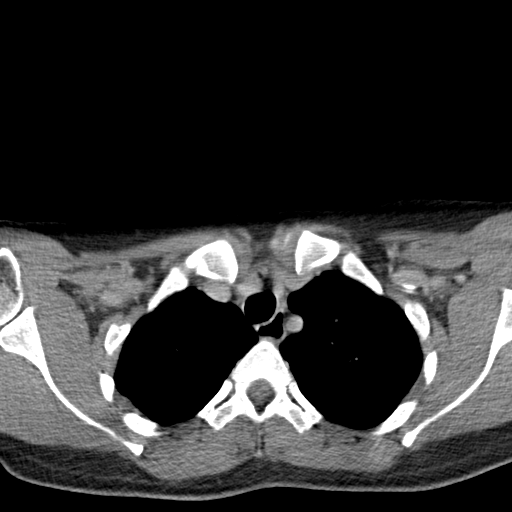
[im 20/119  bone]
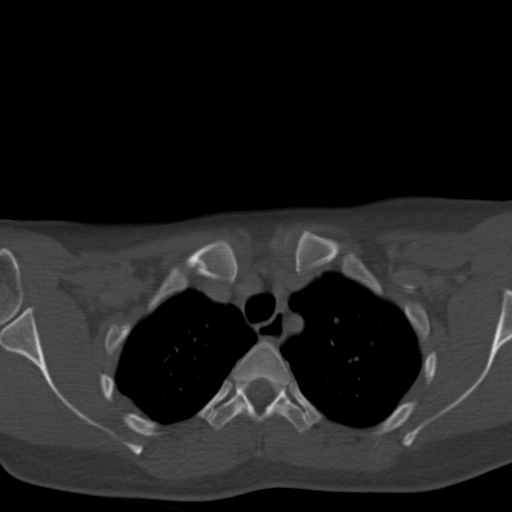
[im 40/119  bone]
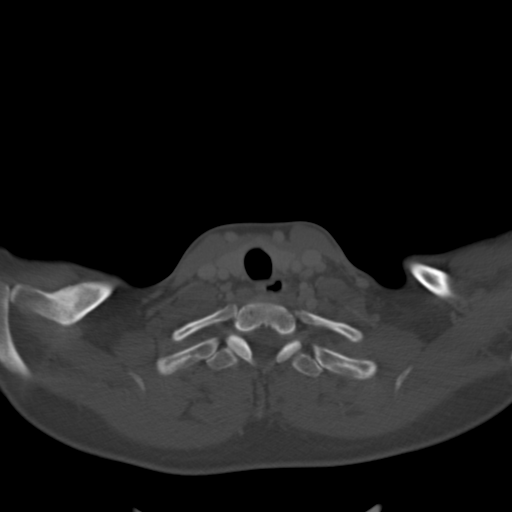
[im 60/119  bone]
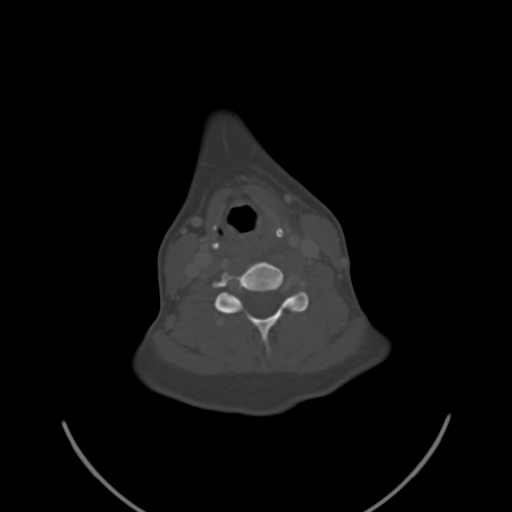
[im 79/119  bone]
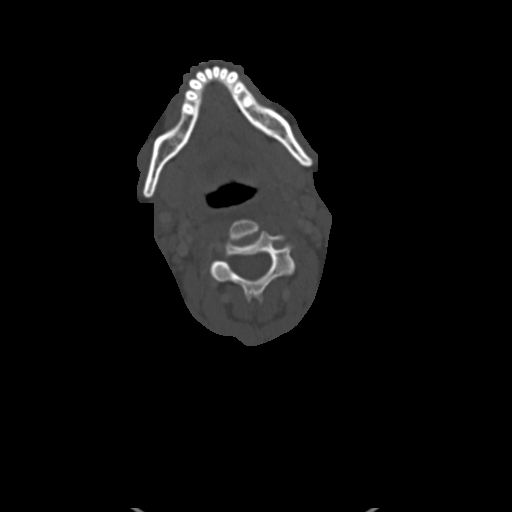
[im 99/119  soft-tissue]
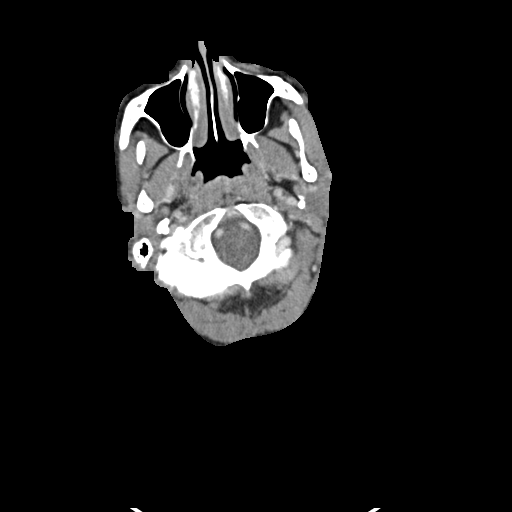
[im 99/119  bone]
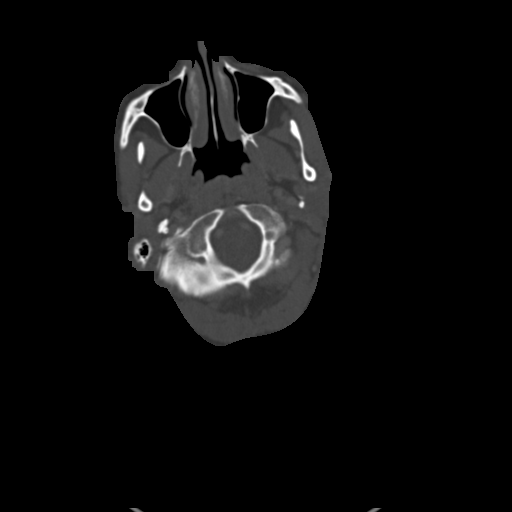

[Series 4: neck 2.0 soft tissue sag · sagittal · 0.49mm/px · 5 of 71 slices shown, 6 images]
[im 24/71  bone]
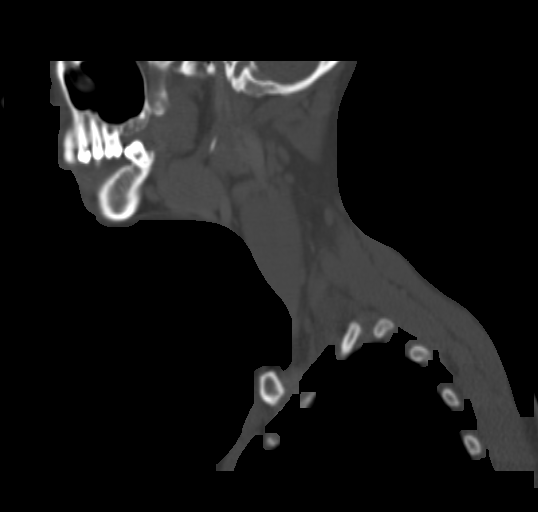
[im 30/71  bone]
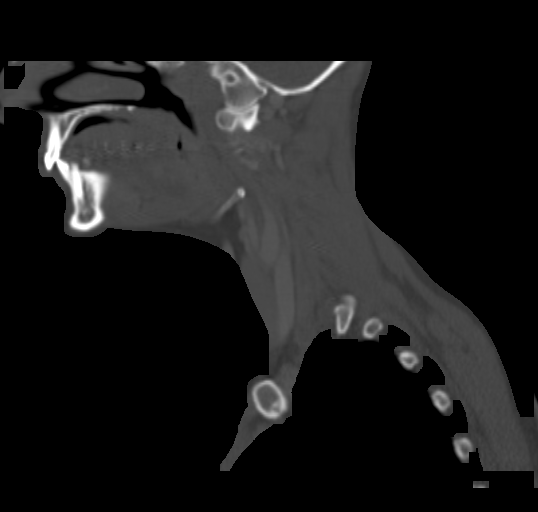
[im 36/71  soft-tissue]
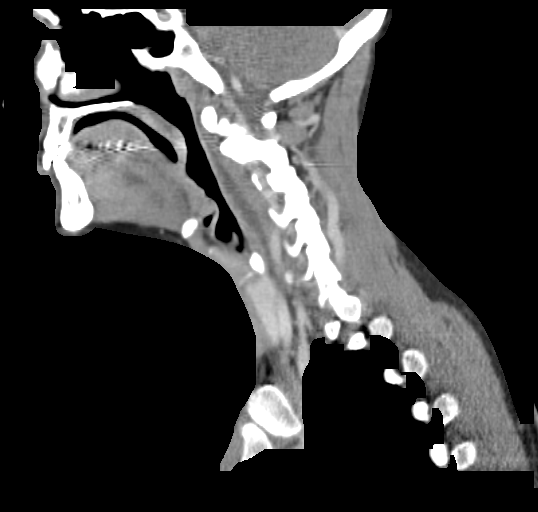
[im 36/71  bone]
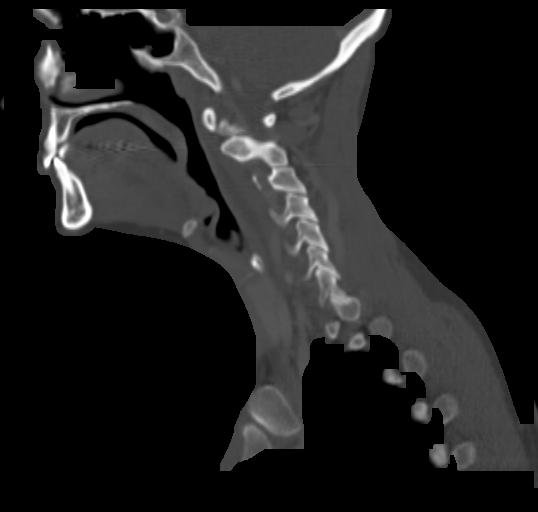
[im 41/71  bone]
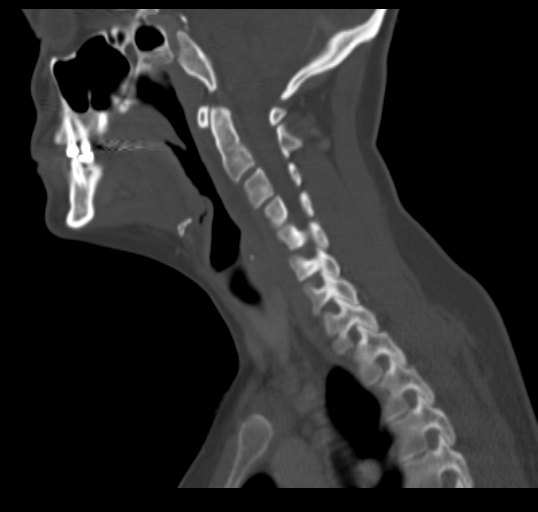
[im 47/71  bone]
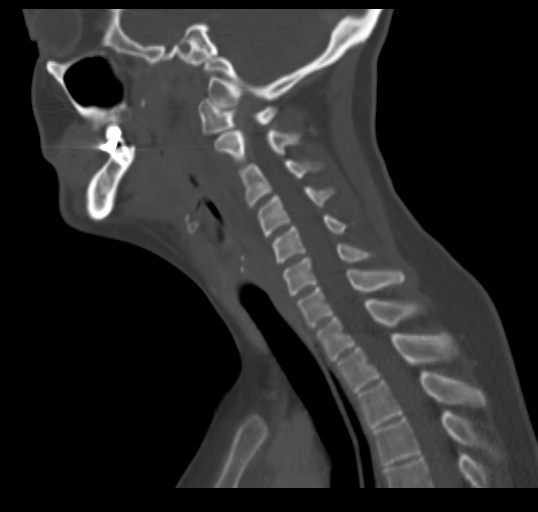

[Series 5: neck 2.0 soft tissue coro · coronal · 0.35mm/px · 3 of 128 slices shown]
[im 26/128  bone]
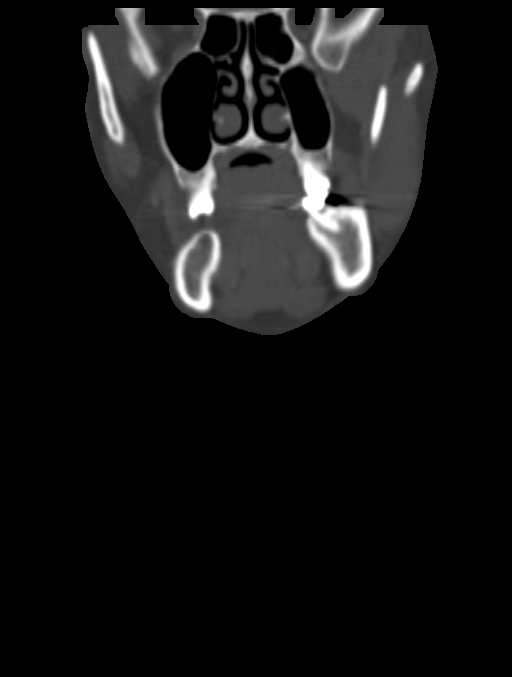
[im 51/128  bone]
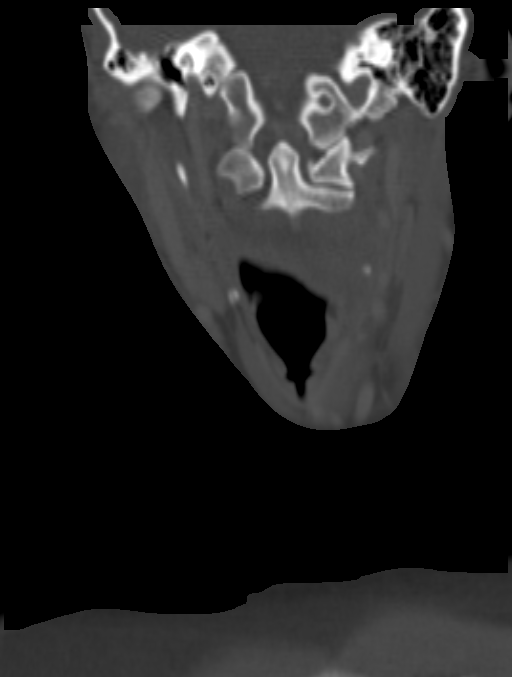
[im 77/128  bone]
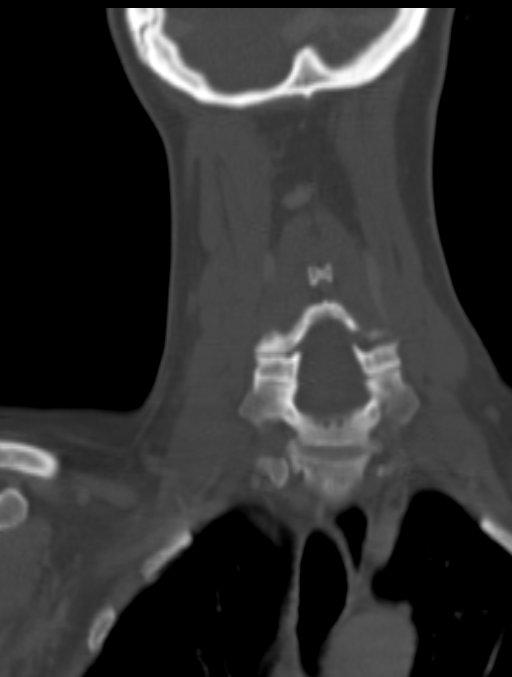

[13 of 33 positions shown; findings below may reference images not displayed]

FINDINGS: Pharynx and larynx: There is prominent low-density fluid in the
retropharynx extending from C2 T C5. This measures up to
approximately 1.3 cm in AP thickness in the midline at the C4 level.
There is mild mass effect on the posterior pharyngeal wall, however
well-defined rim enhancement is not identified. There is asymmetric
intermediate density soft tissue in the left lateral aspect of the
retropharynx which measures approximately 3 x 3 cm at the C3 level.
This extends to the posterior aspect of the left tonsil which is
mildly enlarged compared to the right. Inflammatory change is
present in the left parapharyngeal space and in the left anterior
neck.

Salivary glands: Submandibular parotid glands are unremarkable.

Thyroid: Unremarkable.

Lymph nodes: A mildly prominent left level II lymph node measures 8
mm in short axis, likely reactive. Subcentimeter level III-IV, and V
lymph nodes are also likely reactive.

Vascular: Major vascular structures of the neck appear patent.
Specifically, there is no evidence of internal jugular vein
thrombosis.

Limited intracranial: The visualized portion of the brain is
unremarkable.

Visualized orbits: Unremarkable.

Mastoids and visualized paranasal sinuses: Clear.

Skeleton: Unremarkable. No CT evidence of cervical spine
osteomyelitis or longus [place] calcific tendinitis.

Upper chest: Unremarkable.
IMPRESSION: Prominent retropharyngeal fluid with 3 cm focal phlegmonous change
in the left lateral retropharyngeal space, likely reflecting acute
left-sided tonsillitis which has extended posteriorly. The
retropharyngeal fluid may reflect effusion at this point given the
lack of significant rim enhancement, however developing abscess is
not excluded.

These results were called by telephone at the time of interpretation
on 07/26/2015 at [DATE] to AIDRIS BOTI, PA , who verbally
acknowledged these results.

## 2017-06-16 ENCOUNTER — Ambulatory Visit: Payer: Medicare Other | Admitting: Family Medicine

## 2018-02-24 ENCOUNTER — Other Ambulatory Visit: Payer: Self-pay

## 2018-02-24 ENCOUNTER — Other Ambulatory Visit: Payer: Self-pay | Admitting: Orthopedic Surgery

## 2018-02-24 ENCOUNTER — Encounter (HOSPITAL_BASED_OUTPATIENT_CLINIC_OR_DEPARTMENT_OTHER): Payer: Self-pay | Admitting: *Deleted

## 2018-03-02 ENCOUNTER — Ambulatory Visit (HOSPITAL_BASED_OUTPATIENT_CLINIC_OR_DEPARTMENT_OTHER)
Admission: RE | Admit: 2018-03-02 | Discharge: 2018-03-02 | Disposition: A | Discharge status: Home / Self Care | Payer: Medicare Other | Source: Ambulatory Visit | Attending: Orthopedic Surgery | Admitting: Orthopedic Surgery

## 2018-03-02 ENCOUNTER — Ambulatory Visit (HOSPITAL_BASED_OUTPATIENT_CLINIC_OR_DEPARTMENT_OTHER): Payer: Medicare Other | Admitting: Anesthesiology

## 2018-03-02 ENCOUNTER — Encounter (HOSPITAL_BASED_OUTPATIENT_CLINIC_OR_DEPARTMENT_OTHER)
Admission: RE | Disposition: A | Discharge status: Home / Self Care | Payer: Self-pay | Source: Ambulatory Visit | Attending: Orthopedic Surgery

## 2018-03-02 ENCOUNTER — Encounter (HOSPITAL_BASED_OUTPATIENT_CLINIC_OR_DEPARTMENT_OTHER): Payer: Self-pay | Admitting: *Deleted

## 2018-03-02 ENCOUNTER — Other Ambulatory Visit: Payer: Self-pay

## 2018-03-02 DIAGNOSIS — M87241 Osteonecrosis due to previous trauma, right hand: Secondary | ICD-10-CM | POA: Diagnosis not present

## 2018-03-02 DIAGNOSIS — D649 Anemia, unspecified: Secondary | ICD-10-CM | POA: Insufficient documentation

## 2018-03-02 DIAGNOSIS — G43909 Migraine, unspecified, not intractable, without status migrainosus: Secondary | ICD-10-CM | POA: Diagnosis not present

## 2018-03-02 DIAGNOSIS — Z809 Family history of malignant neoplasm, unspecified: Secondary | ICD-10-CM | POA: Insufficient documentation

## 2018-03-02 DIAGNOSIS — Z8249 Family history of ischemic heart disease and other diseases of the circulatory system: Secondary | ICD-10-CM | POA: Diagnosis not present

## 2018-03-02 DIAGNOSIS — Z8379 Family history of other diseases of the digestive system: Secondary | ICD-10-CM | POA: Diagnosis not present

## 2018-03-02 DIAGNOSIS — Z7982 Long term (current) use of aspirin: Secondary | ICD-10-CM | POA: Diagnosis not present

## 2018-03-02 DIAGNOSIS — Z833 Family history of diabetes mellitus: Secondary | ICD-10-CM | POA: Insufficient documentation

## 2018-03-02 DIAGNOSIS — M654 Radial styloid tenosynovitis [de Quervain]: Secondary | ICD-10-CM | POA: Diagnosis present

## 2018-03-02 DIAGNOSIS — F319 Bipolar disorder, unspecified: Secondary | ICD-10-CM | POA: Insufficient documentation

## 2018-03-02 DIAGNOSIS — F17210 Nicotine dependence, cigarettes, uncomplicated: Secondary | ICD-10-CM | POA: Diagnosis not present

## 2018-03-02 DIAGNOSIS — O926 Galactorrhea: Secondary | ICD-10-CM | POA: Insufficient documentation

## 2018-03-02 HISTORY — PX: CARPECTOMY WITH RADIAL STYLOIDECTOMY: SHX5609

## 2018-03-02 LAB — POCT PREGNANCY, URINE: PREG TEST UR: NEGATIVE

## 2018-03-02 SURGERY — CARPECTOMY WITH RADIAL STYLOIDECTOMY
Anesthesia: Regional | Site: Wrist | Laterality: Right

## 2018-03-02 MED ORDER — OXYCODONE-ACETAMINOPHEN 5-325 MG PO TABS: 1.0000 | ORAL_TABLET | ORAL | 0 refills | Status: AC | PRN

## 2018-03-02 MED ORDER — OXYCODONE HCL 5 MG PO TABS: 5.0000 mg | ORAL_TABLET | Freq: Once | ORAL | Status: DC | PRN

## 2018-03-02 MED ORDER — CEFAZOLIN SODIUM-DEXTROSE 2-4 GM/100ML-% IV SOLN
INTRAVENOUS | Status: AC
  Filled 2018-03-02: qty 100

## 2018-03-02 MED ORDER — ONDANSETRON HCL 4 MG/2ML IJ SOLN: 4.0000 mg | Freq: Once | INTRAMUSCULAR | Status: DC | PRN

## 2018-03-02 MED ORDER — LIDOCAINE HCL (CARDIAC) PF 100 MG/5ML IV SOSY
PREFILLED_SYRINGE | INTRAVENOUS | Status: DC | PRN
  Administered 2018-03-02: 30 mg via INTRAVENOUS

## 2018-03-02 MED ORDER — ROPIVACAINE HCL 5 MG/ML IJ SOLN
INTRAMUSCULAR | Status: DC | PRN
  Administered 2018-03-02: 30 mL via EPIDURAL

## 2018-03-02 MED ORDER — FENTANYL CITRATE (PF) 100 MCG/2ML IJ SOLN
INTRAMUSCULAR | Status: AC
  Filled 2018-03-02: qty 2

## 2018-03-02 MED ORDER — PROPOFOL 500 MG/50ML IV EMUL
INTRAVENOUS | Status: DC | PRN
  Administered 2018-03-02: 75 ug/kg/min via INTRAVENOUS

## 2018-03-02 MED ORDER — SCOPOLAMINE 1 MG/3DAYS TD PT72: 1.0000 | MEDICATED_PATCH | Freq: Once | TRANSDERMAL | Status: DC | PRN

## 2018-03-02 MED ORDER — FENTANYL CITRATE (PF) 100 MCG/2ML IJ SOLN: 25.0000 ug | INTRAMUSCULAR | Status: DC | PRN

## 2018-03-02 MED ORDER — MIDAZOLAM HCL 2 MG/2ML IJ SOLN
INTRAMUSCULAR | Status: AC
  Filled 2018-03-02: qty 2

## 2018-03-02 MED ORDER — CEFAZOLIN SODIUM-DEXTROSE 2-4 GM/100ML-% IV SOLN
2.0000 g | INTRAVENOUS | Status: AC
  Administered 2018-03-02: 2 g via INTRAVENOUS

## 2018-03-02 MED ORDER — OXYCODONE HCL 5 MG/5ML PO SOLN: 5.0000 mg | Freq: Once | ORAL | Status: DC | PRN

## 2018-03-02 MED ORDER — ONDANSETRON HCL 4 MG/2ML IJ SOLN
INTRAMUSCULAR | Status: DC | PRN
  Administered 2018-03-02: 4 mg via INTRAVENOUS

## 2018-03-02 MED ORDER — CHLORHEXIDINE GLUCONATE 4 % EX LIQD: 60.0000 mL | Freq: Once | CUTANEOUS | Status: DC

## 2018-03-02 MED ORDER — FENTANYL CITRATE (PF) 100 MCG/2ML IJ SOLN
50.0000 ug | INTRAMUSCULAR | Status: DC | PRN
  Administered 2018-03-02 (×2): 50 ug via INTRAVENOUS

## 2018-03-02 MED ORDER — MIDAZOLAM HCL 2 MG/2ML IJ SOLN
1.0000 mg | INTRAMUSCULAR | Status: DC | PRN
  Administered 2018-03-02: 2 mg via INTRAVENOUS

## 2018-03-02 MED ORDER — LACTATED RINGERS IV SOLN
INTRAVENOUS | Status: DC
  Administered 2018-03-02 (×2): via INTRAVENOUS

## 2018-03-02 MED ORDER — 0.9 % SODIUM CHLORIDE (POUR BTL) OPTIME
TOPICAL | Status: DC | PRN
  Administered 2018-03-02: 200 mL

## 2018-03-02 SURGICAL SUPPLY — 60 items
APL SKNCLS STERI-STRIP NONHPOA (GAUZE/BANDAGES/DRESSINGS) ×1
BENZOIN TINCTURE PRP APPL 2/3 (GAUZE/BANDAGES/DRESSINGS) ×2 IMPLANT
BLADE ARTHRO LOK 4 BEAVER (BLADE) ×3 IMPLANT
BLADE ARTHRO LOK 4MM BEAVER (BLADE) ×2
BLADE MINI RND TIP GREEN BEAV (BLADE) ×5 IMPLANT
BLADE SURG 15 STRL LF DISP TIS (BLADE) ×1 IMPLANT
BLADE SURG 15 STRL SS (BLADE) ×3
BNDG CMPR 9X4 STRL LF SNTH (GAUZE/BANDAGES/DRESSINGS) ×1
BNDG COHESIVE 3X5 TAN STRL LF (GAUZE/BANDAGES/DRESSINGS) ×3 IMPLANT
BNDG ESMARK 4X9 LF (GAUZE/BANDAGES/DRESSINGS) ×3 IMPLANT
BNDG GAUZE ELAST 4 BULKY (GAUZE/BANDAGES/DRESSINGS) ×3 IMPLANT
CHLORAPREP W/TINT 26ML (MISCELLANEOUS) ×3 IMPLANT
CLOSURE WOUND 1/2 X4 (GAUZE/BANDAGES/DRESSINGS) ×1
CORD BIPOLAR FORCEPS 12FT (ELECTRODE) ×3 IMPLANT
COVER BACK TABLE 60X90IN (DRAPES) ×3 IMPLANT
COVER MAYO STAND STRL (DRAPES) ×3 IMPLANT
COVER WAND RF STERILE (DRAPES) IMPLANT
CUFF TOURNIQUET SINGLE 18IN (TOURNIQUET CUFF) ×2 IMPLANT
DECANTER SPIKE VIAL GLASS SM (MISCELLANEOUS) IMPLANT
DRAPE EXTREMITY T 121X128X90 (DRAPE) ×3 IMPLANT
DRAPE OEC MINIVIEW 54X84 (DRAPES) ×3 IMPLANT
DRAPE SURG 17X23 STRL (DRAPES) ×3 IMPLANT
GAUZE SPONGE 4X4 12PLY STRL (GAUZE/BANDAGES/DRESSINGS) ×3 IMPLANT
GAUZE XEROFORM 1X8 LF (GAUZE/BANDAGES/DRESSINGS) ×3 IMPLANT
GLOVE BIO SURGEON STRL SZ7.5 (GLOVE) ×2 IMPLANT
GLOVE BIOGEL M STRL SZ7.5 (GLOVE) ×2 IMPLANT
GLOVE BIOGEL PI IND STRL 8 (GLOVE) ×1 IMPLANT
GLOVE BIOGEL PI IND STRL 8.5 (GLOVE) ×1 IMPLANT
GLOVE BIOGEL PI INDICATOR 8 (GLOVE) ×6
GLOVE BIOGEL PI INDICATOR 8.5 (GLOVE) ×2
GLOVE SURG ORTHO 8.0 STRL STRW (GLOVE) ×3 IMPLANT
GOWN STRL REUS W/ TWL LRG LVL3 (GOWN DISPOSABLE) IMPLANT
GOWN STRL REUS W/ TWL XL LVL3 (GOWN DISPOSABLE) ×1 IMPLANT
GOWN STRL REUS W/TWL LRG LVL3 (GOWN DISPOSABLE)
GOWN STRL REUS W/TWL XL LVL3 (GOWN DISPOSABLE) ×10 IMPLANT
NS IRRIG 1000ML POUR BTL (IV SOLUTION) ×3 IMPLANT
PACK BASIN DAY SURGERY FS (CUSTOM PROCEDURE TRAY) ×3 IMPLANT
PAD CAST 3X4 CTTN HI CHSV (CAST SUPPLIES) ×1 IMPLANT
PADDING CAST ABS 3INX4YD NS (CAST SUPPLIES)
PADDING CAST ABS 4INX4YD NS (CAST SUPPLIES) ×2
PADDING CAST ABS COTTON 3X4 (CAST SUPPLIES) IMPLANT
PADDING CAST ABS COTTON 4X4 ST (CAST SUPPLIES) ×1 IMPLANT
PADDING CAST COTTON 3X4 STRL (CAST SUPPLIES) ×3
SLEEVE SCD COMPRESS KNEE MED (MISCELLANEOUS) ×3 IMPLANT
SPLINT PLASTER CAST XFAST 3X15 (CAST SUPPLIES) IMPLANT
SPLINT PLASTER XTRA FASTSET 3X (CAST SUPPLIES) ×20
STOCKINETTE 4X48 STRL (DRAPES) ×3 IMPLANT
STRIP CLOSURE SKIN 1/2X4 (GAUZE/BANDAGES/DRESSINGS) ×1 IMPLANT
SUT ETHILON 4 0 PS 2 18 (SUTURE) ×3 IMPLANT
SUT MERSILENE 2.0 SH NDLE (SUTURE) ×2 IMPLANT
SUT MNCRL AB 4-0 PS2 18 (SUTURE) ×2 IMPLANT
SUT VIC AB 0 CT1 27 (SUTURE)
SUT VIC AB 0 CT1 27XBRD ANBCTR (SUTURE) IMPLANT
SUT VIC AB 2-0 SH 27 (SUTURE)
SUT VIC AB 2-0 SH 27XBRD (SUTURE) IMPLANT
SUT VICRYL 4-0 PS2 18IN ABS (SUTURE) ×3 IMPLANT
SYR BULB 3OZ (MISCELLANEOUS) ×3 IMPLANT
SYR CONTROL 10ML LL (SYRINGE) IMPLANT
TOWEL GREEN STERILE FF (TOWEL DISPOSABLE) ×3 IMPLANT
UNDERPAD 30X30 (UNDERPADS AND DIAPERS) ×3 IMPLANT

## 2018-03-02 NOTE — H&P (Signed)
Kathryn Mcneil is an 37 y.o. female.   Chief Complaint: right wrist painHPI: Kathryn Mcneil is a 37 year old right-hand-dominant female who comes in the request of Dr. Gerarda Fraction for consultation regarding pain on the radial aspect of her right  wrist. This been going on for at least a year and a half. She she has been seen by him he diagnosed de Quervain's and released approximately 5 months ago. She has had continued pain following that. 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 Sharp constant aching pain with a VAS score 8/10. Is not improved following the initial presentation. She has been casted x3. He has no prior history of injury.  She has no history of diabetes thyroid problems arthritis gout. Family history is positive diabetes negative for thyroid problems arthritis and gout. She has been tested for diabetes. Her pain is located on the radial aspect of her wrist and goes slightly up her forearm   Past Medical History:  Diagnosis Date  . Abnormal Pap smear   . Anemia   . Benign neoplasm of pituitary gland ([place])   . Bipolar 1 disorder, depressed ([place])    biopolar  . Depression   . Galactorrhea   . Migraine    without aura  . Nausea   . Ulcer     Past Surgical History:  Procedure Laterality Date  . APPENDECTOMY    . LEEP     leep  . TUBAL LIGATION      Family History  Problem Relation Age of Onset  . Hypertension Mother   . Ulcers Mother        gastric  . Diabetes Maternal Grandmother   . Cancer Maternal Grandfather    Social History:  reports that she has been smoking cigarettes. She has been smoking about 1.00 pack per day. She has never used smokeless tobacco. She reports that she does not drink alcohol or use drugs.  Allergies: No Known Allergies  Medications Prior to Admission  Medication Sig Dispense Refill  . ibuprofen (ADVIL,MOTRIN) 600 MG tablet Take 600 mg by mouth every 6 (six) hours as needed.       No results found for this or any previous visit (from the past 48 hour(s)).  No results found.   Pertinent items are noted in HPI.  Height 5\' 4"  (1.626 m), weight 57.2 kg, last menstrual period 02/11/2018.  General appearance: alert, cooperative and appears stated age Head: Normocephalic, without obvious abnormality Neck: no JVD Resp: clear to auscultation bilaterally Cardio: regular rate and rhythm, S1, S2 normal, no murmur, click, rub or gallop GI: soft, non-tender; bowel sounds normal; no masses,  no organomegaly Extremities: right wrist pain Pulses: 2+ and symmetric Skin: Skin color, texture, turgor normal. No rashes or lesions Neurologic: Grossly normal Incision/Wound: na  Assessment/Plan Assessment:  1. AVN (avascular necrosis of , Scaphoid)    Plan: We have discussed with her various treatment alternatives and bone graft screw fixation scaphoid excision ulnar for bone fusion. Proximal carpectomy complete fusion of her wrist. Complete wrist fusion. We have discussed at length the recovery. For each potential risks and hazards the possibility of nonunion failure of revascularization of the proximal row degeneration of either a proximal carpectomy or scaphoid excision ulnar for bone fusion length of immobilization afterwards. She states that she does not want to spend a lot of time in rehabilitation nor does she want prolonged casting. She would like to have a proximal row carpectomy  done. She is aware that this will degenerate with time. Would recommend a proximal carpectomy PIN resection & partial excisiopn of her radial styloid wrist as this can be done as an outpatient under regional anesthesia. Questions are encouraged and answered to her satisfaction.   Daryll Brod 03/02/2018, 1:37 PM

## 2018-03-02 NOTE — Op Note (Signed)
I assisted Surgeon(s) and Role:    Daryll Brod, MD - Primary on the Procedure(s): PROXIMAL ROW CARPECTOMY RIGHT WRIST WITH POSSIBLE RADIAL STYLOIDECTOMY, POSTERIOR INTEROSSEOUS NERVE RESECTION on 03/02/2018.  I provided assistance on this case as follows: retraction soft tissues, positioning of wrist.  Electronically signed by: Leanora Cover, MD Date: 03/02/2018 Time: 5:23 PM

## 2018-03-02 NOTE — Transfer of Care (Signed)
Immediate Anesthesia Transfer of Care Note  Patient: Kathryn Mcneil  Procedure(s) Performed: PROXIMAL ROW CARPECTOMY RIGHT WRIST WITH POSSIBLE RADIAL STYLOIDECTOMY, POSTERIOR INTEROSSEOUS NERVE RESECTION (Right Wrist)  Patient Location: PACU  Anesthesia Type:MAC combined with regional for post-op pain  Level of Consciousness: awake, alert , oriented and patient cooperative  Airway & Oxygen Therapy: Patient Spontanous Breathing and Patient connected to face mask oxygen  Post-op Assessment: Report given to RN and Post -op Vital signs reviewed and stable  Post vital signs: Reviewed and stable  Last Vitals:  Vitals Value Taken Time  BP    Temp    Pulse    Resp    SpO2      Last Pain:  Vitals:   03/02/18 1338  PainSc: 8       Patients Stated Pain Goal: 3 (00/63/49 4944)  Complications: No apparent anesthesia complications

## 2018-03-02 NOTE — Discharge Instructions (Addendum)
Post Anesthesia Home Care Instructions  Activity: Get plenty of rest for the remainder of the day. A responsible individual must stay with you for 24 hours following the procedure.  For the next 24 hours, DO NOT: -Drive a car -Operate machinery -Drink alcoholic beverages -Take any medication unless instructed by your physician -Make any legal decisions or sign important papers.  Meals: Start with liquid foods such as gelatin or soup. Progress to regular foods as tolerated. Avoid greasy, spicy, heavy foods. If nausea and/or vomiting occur, drink only clear liquids until the nausea and/or vomiting subsides. Call your physician if vomiting continues.  Special Instructions/Symptoms: Your throat may feel dry or sore from the anesthesia or the breathing tube placed in your throat during surgery. If this causes discomfort, gargle with warm salt water. The discomfort should disappear within 24 hours.  If you had a scopolamine patch placed behind your ear for the management of post- operative nausea and/or vomiting:  1. The medication in the patch is effective for 72 hours, after which it should be removed.  Wrap patch in a tissue and discard in the trash. Wash hands thoroughly with soap and water. 2. You may remove the patch earlier than 72 hours if you experience unpleasant side effects which may include dry mouth, dizziness or visual disturbances. 3. Avoid touching the patch. Wash your hands with soap and water after contact with the patch.      Regional Anesthesia Blocks  1. Numbness or the inability to move the "blocked" extremity may last from 3-48 hours after placement. The length of time depends on the medication injected and your individual response to the medication. If the numbness is not going away after 48 hours, call your surgeon.  2. The extremity that is blocked will need to be protected until the numbness is gone and the  Strength has returned. Because you cannot feel it, you  will need to take extra care to avoid injury. Because it may be weak, you may have difficulty moving it or using it. You may not know what position it is in without looking at it while the block is in effect.  3. For blocks in the legs and feet, returning to weight bearing and walking needs to be done carefully. You will need to wait until the numbness is entirely gone and the strength has returned. You should be able to move your leg and foot normally before you try and bear weight or walk. You will need someone to be with you when you first try to ensure you do not fall and possibly risk injury.  4. Bruising and tenderness at the needle site are common side effects and will resolve in a few days.  5. Persistent numbness or new problems with movement should be communicated to the surgeon or the [facility] ([phone])/ [facility] ([phone]).    [facility] Center Instructions Hand Surgery  Wound Care: Keep your hand elevated above the level of your heart.  Do not allow it to dangle by your side.  Keep the dressing dry and do not remove it unless your doctor advises you to do so.  He will usually change it at the time of your post-op visit.  Moving your fingers is advised to stimulate circulation but will depend on the site of your surgery.  If you have a splint applied, your doctor will advise you regarding movement.  Activity: Do not drive or operate machinery today.  Rest today and   then you may return to your normal activity and work as indicated by your physician.  Diet:  Drink liquids today or eat a light diet.  You may resume a regular diet tomorrow.    General expectations: Pain for two to three days. Fingers may become slightly swollen.  Call your doctor if any of the following occur: Severe pain not relieved by pain medication. Elevated temperature. Dressing soaked with blood. Inability to move fingers. White or bluish color to fingers.  

## 2018-03-02 NOTE — Brief Op Note (Signed)
03/02/2018  5:21 PM  PATIENT:  Kathryn Mcneil  37 y.o. female  PRE-OPERATIVE DIAGNOSIS:  PREISER'S DISEASE RIGHT WRIST  POST-OPERATIVE DIAGNOSIS:  PREISER'S DISEASE RIGHT WRIST  PROCEDURE:  Procedure(s) with comments: PROXIMAL ROW CARPECTOMY RIGHT WRIST WITH POSSIBLE RADIAL STYLOIDECTOMY, POSTERIOR INTEROSSEOUS NERVE RESECTION (Right) - AXILLARY BLOCK  SURGEON:  Surgeon(s) and Role:    * Daryll Brod, MD - Primary  PHYSICIAN ASSISTANT:   ASSISTANTS: K Naisha Wisdom,MD   ANESTHESIA:   regional and IV sedation  EBL: 68ml BLOOD ADMINISTERED:none  DRAINS: none   LOCAL MEDICATIONS USED:  NONE  SPECIMEN:  No Specimen  DISPOSITION OF SPECIMEN:  N/A  COUNTS:  YES  TOURNIQUET:  * Missing tourniquet times found for documented tourniquets in log: [id number] *  DICTATION: .Dragon Dictation  PLAN OF CARE: Discharge to home after PACU  PATIENT DISPOSITION:  PACU - hemodynamically stable.

## 2018-03-02 NOTE — Anesthesia Preprocedure Evaluation (Signed)
Anesthesia Evaluation  Patient identified by MRN, date of birth, ID band Patient awake    Reviewed: Allergy & Precautions, NPO status , Patient's Chart, lab work & pertinent test results  History of Anesthesia Complications Negative for: history of anesthetic complications  Airway Mallampati: II  TM Distance: >3 FB Neck ROM: Full    Dental no notable dental hx.    Pulmonary Current Smoker,    Pulmonary exam normal        Cardiovascular negative cardio ROS Normal cardiovascular exam     Neuro/Psych  Headaches, PSYCHIATRIC DISORDERS Depression Bipolar Disorder Benign pituitary tumor- did not require surgery. No sxs currently but did have slightly elevated prolactin level. Being followed.    GI/Hepatic negative GI ROS, Neg liver ROS,   Endo/Other  negative endocrine ROS  Renal/GU negative Renal ROS  negative genitourinary   Musculoskeletal negative musculoskeletal ROS (+)   Abdominal   Peds  Hematology negative hematology ROS (+)   Anesthesia Other Findings   Reproductive/Obstetrics negative OB ROS                             Anesthesia Physical Anesthesia Plan  ASA: II  Anesthesia Plan: Regional   Post-op Pain Management:  Regional for Post-op pain   Induction:   PONV Risk Score and Plan: 1 and Propofol infusion and Treatment may vary due to age or medical condition  Airway Management Planned: Natural Airway, Nasal Cannula and Simple Face Mask  Additional Equipment: None  Intra-op Plan:   Post-operative Plan:   Informed Consent: I have reviewed the patients History and Physical, chart, labs and discussed the procedure including the risks, benefits and alternatives for the proposed anesthesia with the patient or authorized representative who has indicated his/her understanding and acceptance.     Plan Discussed with:   Anesthesia Plan Comments:         Anesthesia  Quick Evaluation

## 2018-03-02 NOTE — Progress Notes (Signed)
Assisted Dr. Witman with right, ultrasound guided, supraclavicular block. Side rails up, monitors on throughout procedure. See vital signs in flow sheet. Tolerated Procedure well. 

## 2018-03-02 NOTE — Op Note (Signed)
NAME: Kathryn Mcneil MEDICAL RECORD NO: [id number] DATE OF BIRTH: 07-10-1980 FACILITY: [facility] LOCATION: [facility] PHYSICIAN: Wynonia Sours, MD   OPERATIVE REPORT   DATE OF PROCEDURE: 03/02/18    PREOPERATIVE DIAGNOSIS:   Preiser's disease right wrist   POSTOPERATIVE DIAGNOSIS:   Same   PROCEDURE:   Proximal carpectomy with posterior interosseous nerve resection and radial styloidectomy osseous nerve resected being done for preoperative pain   SURGEON: Daryll Brod, M.D.   ASSISTANT: Leanora Cover, MD   ANESTHESIA:   Regional with sedation   INTRAVENOUS FLUIDS:  Per anesthesia flow sheet.   ESTIMATED BLOOD LOSS:  Minimal.   COMPLICATIONS:  None.   SPECIMENS:  none   TOURNIQUET TIME:    Total Tourniquet Time Documented: Upper Arm (Right) - 49 minutes Total: Upper Arm (Right) - 49 minutes    DISPOSITION:  Stable to PACU.   INDICATIONS: Patient is a 37 year old female with a history of wrist pain.  She has undergone a de Quervain's tendinitis release without relief.  X-rays reveal Preiser's disease with a fracture and collapse of the proximal half of the scaphoid with increasing pain.  This is not responded to any conservative treatment including injections casting.  We have had a long discussion with respect to various treatment alternatives including revascularization of the scaphoid with protection proximal carpectomy ulnar for bone fusion complete fusion of the wrist with risks and complications of each she states that she wants to have this treated in the most expeditious way and has elected to undergo proximal row carpectomy.  Pre-peri-and postoperative course been discussed along with risks and complications.  She is aware that there is no guarantee to the surgery the possibility of infection recurrence injury to arteries nerves tendons complete relief of symptoms and dystrophy.  Preoperative area the patient seen extremity marked by both patient and surgeon  antibiotic given  OPERATIVE COURSE: Procedure the patient brought to the operating room after supraclavicular block was carried out without difficulty in the preoperative area under the direction of the anesthesia department.  She was prepped using ChloraPrep in the supine position with the right arm free at 3-minute dry time was allowed and a timeout taken to confirm patient procedure.  A longitudinal incision was made over the dorsum of her right hand just ulnar to Lister's tubercle.  Carried this was carried down through subcutaneous tissue.  Bleeders were electrocauterized with bipolar.  Dorsal neural structures were identified protected as much as possible.  The dissection was carried through the extensor retinaculum at Lister's tubercle.  The EPL tendon was released.  The posterior interosseous nerve was identified this was isolated and cut approximately 3 to 4 cm proximal to the dorsal aspect of the distal radius.  An incision was made longitudinally in the capsule after retracting the extensor tendons ulnarly and radially.  A tear was made in the attachment of the capsule at the distal radius.  This allowed visualization of the radiocarpal joint.  The scapholunate ligament was incised to collapse of the scaphoid was immediately apparent.  The bone was extremely soft to yellow and had fragmented.  Was removed with a rondure including the distal pole.  A radial styloidectomy was then performed after isolating the styloid this was removed with a rondure protecting the ER radioscaphocapitate ligament throughout the this portion of the procedure.  The triquetrum was then isolated with blunt sharp dissection.  This was removed using freer elevators Beaver blades this was removed in piecemeal taking  care to preserve the volar ligaments.  The lunate was then isolated.  This was stabilized with a towel clamp.  The volar capsule was then dissected free in a subperiosteal manner maintaining the volar capsule as much  as possible.  The scaphoid lunate and triquetrum were entirely removed.  The hand make showed changes on the proximal aspect.  The articular surface of the distal radius proximal capitate showed no significant degenerative changes.  The capitate translocated directly into the lunate facet confirmed with image intensification.  The wound was copiously irrigated with saline.  The capsule was then closed with a horizontal mattress 3-0 Mersilene sutures.  The extensor retinaculum was repaired with figure-of-eight 4-0 Vicryl sutures.  The subcutaneous tissue was closed with interrupted 4-0 Vicryl and the skin with subcuticular 4-0 Monocryl and Steri-Strips and benzoin.  A sterile compressive dressing dorsal palmar splint was applied x-rays taken AP lateral direction revealed that the capitate Leiden lunate facet.  The patient was taken to the recovery room for observation after completion of the dressing deflation of the tourniquet with all fingers pinking.  She will be discharged home to return [facility] in 1 week on Percocet.    Daryll Brod, MD Electronically signed, 03/02/18

## 2018-03-02 NOTE — Anesthesia Procedure Notes (Addendum)
Anesthesia Regional Block: Supraclavicular block   Pre-Anesthetic Checklist: ,, timeout performed, Correct Patient, Correct Site, Correct Laterality, Correct Procedure, Correct Position, site marked, Risks and benefits discussed,  Surgical consent,  Pre-op evaluation,  At surgeon's request and post-op pain management  Laterality: Right  Prep: chloraprep       Needles:  Injection technique: Single-shot  Needle Type: Echogenic Stimulator Needle     Needle Length: 9cm  Needle Gauge: 21     Additional Needles:   Procedures:,,,, ultrasound used (permanent image in chart),,,,  Narrative:  Start time: 03/02/2018 2:30 PM End time: 03/02/2018 2:40 PM Injection made incrementally with aspirations every 5 mL.  Performed by: Personally  Anesthesiologist: Lidia Collum, MD  Additional Notes: Monitors applied. Injection made in 5cc increments. No resistance to injection. Good needle visualization. Patient tolerated procedure well.

## 2018-03-02 NOTE — Anesthesia Procedure Notes (Signed)
Procedure Name: MAC Date/Time: 03/02/2018 4:32 PM Performed by: Signe Colt, CRNA Pre-anesthesia Checklist: Emergency Drugs available, Suction available, Patient being monitored, Patient identified and Timeout performed Patient Re-evaluated:Patient Re-evaluated prior to induction Oxygen Delivery Method: Simple face mask

## 2018-03-02 NOTE — Anesthesia Postprocedure Evaluation (Signed)
Anesthesia Post Note  Patient: Kathryn Mcneil  Procedure(s) Performed: PROXIMAL ROW CARPECTOMY RIGHT WRIST WITH POSSIBLE RADIAL STYLOIDECTOMY, POSTERIOR INTEROSSEOUS NERVE RESECTION (Right Wrist)     Patient location during evaluation: PACU Anesthesia Type: Regional Level of consciousness: awake and alert Pain management: pain level controlled Vital Signs Assessment: post-procedure vital signs reviewed and stable Respiratory status: spontaneous breathing, nonlabored ventilation and respiratory function stable Cardiovascular status: blood pressure returned to baseline and stable Postop Assessment: no apparent nausea or vomiting Anesthetic complications: no    Last Vitals:  Vitals:   03/02/18 1726 03/02/18 1730  BP: (!) 82/63 (!) 89/71  Pulse: 77 66  Resp: 18 19  Temp: 36.7 C   SpO2: 100% 98%    Last Pain:  Vitals:   03/02/18 1730  PainSc: 0-No pain                 Lidia Collum

## 2018-03-06 ENCOUNTER — Encounter (HOSPITAL_BASED_OUTPATIENT_CLINIC_OR_DEPARTMENT_OTHER): Payer: Self-pay | Admitting: Orthopedic Surgery

## 2018-03-15 DIAGNOSIS — M87243 Osteonecrosis due to previous trauma, unspecified hand: Secondary | ICD-10-CM | POA: Insufficient documentation

## 2018-05-04 ENCOUNTER — Other Ambulatory Visit: Payer: Self-pay | Admitting: Orthopedic Surgery

## 2018-05-04 DIAGNOSIS — Z9889 Other specified postprocedural states: Secondary | ICD-10-CM

## 2018-05-04 DIAGNOSIS — M25531 Pain in right wrist: Secondary | ICD-10-CM

## 2018-05-14 ENCOUNTER — Ambulatory Visit
Admission: RE | Admit: 2018-05-14 | Discharge: 2018-05-14 | Disposition: A | Discharge status: Home / Self Care | Payer: Medicare Other | Source: Ambulatory Visit | Attending: Orthopedic Surgery | Admitting: Orthopedic Surgery

## 2018-05-14 DIAGNOSIS — M25531 Pain in right wrist: Secondary | ICD-10-CM

## 2018-05-14 DIAGNOSIS — Z9889 Other specified postprocedural states: Secondary | ICD-10-CM

## 2018-12-13 ENCOUNTER — Other Ambulatory Visit: Payer: Self-pay | Admitting: Orthopedic Surgery

## 2018-12-13 DIAGNOSIS — Z9889 Other specified postprocedural states: Secondary | ICD-10-CM

## 2019-01-19 DIAGNOSIS — Z9889 Other specified postprocedural states: Secondary | ICD-10-CM | POA: Insufficient documentation

## 2019-03-13 ENCOUNTER — Ambulatory Visit
Admission: RE | Admit: 2019-03-13 | Discharge: 2019-03-13 | Disposition: A | Discharge status: Home / Self Care | Payer: Medicare Other | Source: Ambulatory Visit | Attending: Orthopedic Surgery | Admitting: Orthopedic Surgery

## 2019-03-13 ENCOUNTER — Other Ambulatory Visit: Payer: Self-pay

## 2019-03-13 DIAGNOSIS — Z9889 Other specified postprocedural states: Secondary | ICD-10-CM

## 2019-03-13 MED ORDER — IOPAMIDOL (ISOVUE-M 200) INJECTION 41%
1.0000 mL | Freq: Once | INTRAMUSCULAR | Status: AC
  Administered 2019-03-13: 1 mL via INTRA_ARTICULAR

## 2019-10-22 ENCOUNTER — Ambulatory Visit: Payer: Medicare Other | Admitting: "Endocrinology

## 2019-12-27 ENCOUNTER — Ambulatory Visit (HOSPITAL_COMMUNITY)
Admission: EM | Admit: 2019-12-27 | Discharge: 2019-12-27 | Discharge status: Against Medical Advice | Payer: Medicare Other | Attending: Family Medicine | Admitting: Family Medicine

## 2019-12-27 ENCOUNTER — Other Ambulatory Visit: Payer: Self-pay

## 2019-12-27 NOTE — ED Notes (Signed)
No answer when called for room x 3 

## 2020-01-30 ENCOUNTER — Other Ambulatory Visit: Payer: Self-pay

## 2020-01-30 ENCOUNTER — Emergency Department (HOSPITAL_BASED_OUTPATIENT_CLINIC_OR_DEPARTMENT_OTHER)
Admission: EM | Admit: 2020-01-30 | Discharge: 2020-01-30 | Discharge status: Against Medical Advice | Payer: Medicare Other

## 2020-01-31 ENCOUNTER — Encounter: Payer: Self-pay | Admitting: Emergency Medicine

## 2020-01-31 ENCOUNTER — Ambulatory Visit
Admission: EM | Admit: 2020-01-31 | Discharge: 2020-01-31 | Disposition: A | Discharge status: Home / Self Care | Payer: Medicare Other | Attending: Emergency Medicine | Admitting: Emergency Medicine

## 2020-01-31 DIAGNOSIS — U071 COVID-19: Secondary | ICD-10-CM | POA: Diagnosis not present

## 2020-01-31 DIAGNOSIS — J029 Acute pharyngitis, unspecified: Secondary | ICD-10-CM | POA: Diagnosis not present

## 2020-01-31 DIAGNOSIS — K120 Recurrent oral aphthae: Secondary | ICD-10-CM | POA: Diagnosis not present

## 2020-01-31 MED ORDER — TRIAMCINOLONE ACETONIDE 0.1 % MT PSTE: 1.0000 "application " | PASTE | Freq: Two times a day (BID) | OROMUCOSAL | 12 refills | Status: DC

## 2020-01-31 NOTE — Discharge Instructions (Addendum)
  Get plenty of rest and push fluids Use OTC Cepacol lozenges or Hall's lozenges for sore throat Prescribed oralone for canker sore COVID voice care clinic information was provided Use medications daily for symptom relief Use OTC medications like ibuprofen or tylenol as needed fever or pain Call or go to the ED if you have any new or worsening symptoms such as fever, worsening cough, shortness of breath, chest tightness, chest pain, turning blue, changes in mental status, etc..

## 2020-01-31 NOTE — ED Triage Notes (Signed)
Patient states that she had COVID about 1 month ago, has hole in her tongue and sinus congestion. Has inhaler for sob.

## 2020-01-31 NOTE — ED Provider Notes (Signed)
[place]   [id number] 01/31/20 Arrival Time: 4944   CC: COVID sequalae  SUBJECTIVE: History from: patient and family.  Kathryn Mcneil is a 39 y.o. female who presented to the urgent care for complaint of sore throat, cough and canker sore for the past 2 days.  Had Covid infection 25 days ago.  Denies sick exposure to COVID, flu or strep.  Denies recent travel.  Has tried OTC medication without relief denies alleviating factors.  Denies previous symptoms in the past.   Denies fever, chills, fatigue, sinus pain, rhinorrhea, sore throat, SOB, wheezing, chest pain, nausea, changes in bowel or bladder habits.     ROS: As per HPI.  All other pertinent ROS negative.     Past Medical History:  Diagnosis Date   Abnormal Pap smear    Anemia    Benign neoplasm of pituitary gland (HCC)    Bipolar 1 disorder, depressed ([place])    biopolar   Depression    Galactorrhea    Migraine    without aura   Nausea    Ulcer    Past Surgical History:  Procedure Laterality Date   APPENDECTOMY     CARPECTOMY WITH RADIAL STYLOIDECTOMY Right 03/02/2018   Procedure: PROXIMAL ROW CARPECTOMY RIGHT WRIST WITH POSSIBLE RADIAL STYLOIDECTOMY, POSTERIOR INTEROSSEOUS NERVE RESECTION;  Surgeon: Daryll Brod, MD;  Location: [facility];  Service: Orthopedics;  Laterality: Right;  AXILLARY BLOCK   LEEP     leep   TUBAL LIGATION     No Known Allergies No current facility-administered medications on file prior to encounter.   Current Outpatient Medications on File Prior to Encounter  Medication Sig Dispense Refill   ibuprofen (ADVIL,MOTRIN) 600 MG tablet Take 600 mg by mouth every 6 (six) hours as needed.     Social History   Socioeconomic History   Marital status: Legally Separated    Spouse name: Not on file   Number of children: Not on file   Years of education: Not on file   Highest education level: Not on file  Occupational History   Not on file  Tobacco  Use   Smoking status: Current Every Day Smoker    Packs/day: 1.00    Types: Cigarettes   Smokeless tobacco: Never Used   Tobacco comment: 1 pkg a day.  Substance and Sexual Activity   Alcohol use: No   Drug use: No   Sexual activity: Yes    Birth control/protection: Surgical  Other Topics Concern   Not on file  Social History Narrative   Not on file   Social Determinants of Health   Financial Resource Strain:    Difficulty of Paying Living Expenses: Not on file  Food Insecurity:    Worried About [place] in the Last Year: Not on file   Ran Out of Food in the Last Year: Not on file  Transportation Needs:    Lack of Transportation (Medical): Not on file   Lack of Transportation (Non-Medical): Not on file  Physical Activity:    Days of Exercise per Week: Not on file   Minutes of Exercise per Session: Not on file  Stress:    Feeling of Stress : Not on file  Social Connections:    Frequency of Communication with Friends and Family: Not on file   Frequency of Social Gatherings with Friends and Family: Not on file   Attends Religious Services: Not on file   Active Member of Clubs or Organizations: Not  on file   Attends Archivist Meetings: Not on file   Marital Status: Not on file  Intimate Partner Violence:    Fear of Current or Ex-Partner: Not on file   Emotionally Abused: Not on file   Physically Abused: Not on file   Sexually Abused: Not on file   Family History  Problem Relation Age of Onset   Hypertension Mother    Ulcers Mother        gastric   Diabetes Maternal Grandmother    Cancer Maternal Grandfather     OBJECTIVE:  Vitals:   01/31/20 1405  BP: 126/86  Pulse: 67  Resp: 15  Temp: 98.3 F (36.8 C)  SpO2: 99%     General appearance: alert; appears fatigued, but nontoxic; speaking in full sentences and tolerating own secretions HEENT: NCAT; Ears: EACs clear, TMs pearly gray; Eyes: PERRL.  EOM grossly  intact. Sinuses: nontender; Nose: nares patent without rhinorrhea, Throat: oropharynx clear, tonsils non erythematous or enlarged, uvula midline , canker sore present on tongue Neck: supple without LAD Lungs: unlabored respirations, symmetrical air entry; cough: mild; no respiratory distress; CTAB Heart: regular rate and rhythm.  Radial pulses 2+ symmetrical bilaterally Skin: warm and dry Psychological: alert and cooperative; normal mood and affect  LABS:  No results found for this or any previous visit (from the past 24 hour(s)).   ASSESSMENT & PLAN:  1. Canker sore   2. COVID-19 virus infection     Meds ordered this encounter  Medications   triamcinolone (KENALOG) 0.1 % paste    Sig: Use as directed 1 application in the mouth or throat 2 (two) times daily.    Dispense:  5 g    Refill:  12    Discharge Instrcutions     Get plenty of rest and push fluids Use OTC Cepacol lozenges or Hall's lozenges for sore throat Prescribed oralone for canker sore COVID voice care clinic information was provided Use medications daily for symptom relief Use OTC medications like ibuprofen or tylenol as needed fever or pain Call or go to the ED if you have any new or worsening symptoms such as fever, worsening cough, shortness of breath, chest tightness, chest pain, turning blue, changes in mental status, etc...   Reviewed expectations re: course of current medical issues. Questions answered. Outlined signs and symptoms indicating need for more acute intervention. Patient verbalized understanding. After Visit Summary given.         Emerson Monte, [place] 01/31/20 1441

## 2020-02-19 DIAGNOSIS — F319 Bipolar disorder, unspecified: Secondary | ICD-10-CM | POA: Insufficient documentation

## 2020-02-19 DIAGNOSIS — Z86018 Personal history of other benign neoplasm: Secondary | ICD-10-CM | POA: Insufficient documentation

## 2020-06-05 ENCOUNTER — Other Ambulatory Visit: Payer: Self-pay

## 2020-06-05 ENCOUNTER — Encounter (HOSPITAL_BASED_OUTPATIENT_CLINIC_OR_DEPARTMENT_OTHER): Payer: Self-pay | Admitting: *Deleted

## 2020-06-05 ENCOUNTER — Emergency Department (HOSPITAL_BASED_OUTPATIENT_CLINIC_OR_DEPARTMENT_OTHER)
Admission: EM | Admit: 2020-06-05 | Discharge: 2020-06-05 | Disposition: A | Discharge status: Home / Self Care | Payer: Medicare Other | Attending: Emergency Medicine | Admitting: Emergency Medicine

## 2020-06-05 DIAGNOSIS — K1230 Oral mucositis (ulcerative), unspecified: Secondary | ICD-10-CM | POA: Diagnosis not present

## 2020-06-05 DIAGNOSIS — U071 COVID-19: Secondary | ICD-10-CM | POA: Insufficient documentation

## 2020-06-05 DIAGNOSIS — Z86018 Personal history of other benign neoplasm: Secondary | ICD-10-CM | POA: Insufficient documentation

## 2020-06-05 DIAGNOSIS — F17210 Nicotine dependence, cigarettes, uncomplicated: Secondary | ICD-10-CM | POA: Diagnosis not present

## 2020-06-05 DIAGNOSIS — J069 Acute upper respiratory infection, unspecified: Secondary | ICD-10-CM | POA: Diagnosis not present

## 2020-06-05 DIAGNOSIS — R1110 Vomiting, unspecified: Secondary | ICD-10-CM | POA: Diagnosis not present

## 2020-06-05 DIAGNOSIS — K121 Other forms of stomatitis: Secondary | ICD-10-CM

## 2020-06-05 MED ORDER — MUPIROCIN CALCIUM 2 % NA OINT: TOPICAL_OINTMENT | NASAL | 0 refills | Status: DC

## 2020-06-05 MED ORDER — ACYCLOVIR 400 MG PO TABS: 400.0000 mg | ORAL_TABLET | Freq: Every day | ORAL | 0 refills | Status: AC

## 2020-06-05 MED ORDER — LIDOCAINE VISCOUS HCL 2 % MT SOLN
15.0000 mL | Freq: Once | OROMUCOSAL | Status: AC
  Administered 2020-06-05: 15 mL via OROMUCOSAL
  Filled 2020-06-05: qty 15

## 2020-06-05 NOTE — ED Provider Notes (Signed)
[facility] EMERGENCY DEPARTMENT Provider Note   CSN: [id number] Arrival date & time: 06/05/20  2050     History Chief Complaint  Patient presents with  . Mouth Lesions    Kathryn Mcneil is a 40 y.o. female.  HPI   40 year old female with a history of anemia, bipolar disorder, depression, galactorrhea, migraine, who presents emergency department today for evaluation of mouth ulcers.  States she has had some lesions around her mouth for the last 3 days.  They are somewhat painful and uncomfortable.  She also reports she has 7 to her tongue and it hurts for her to swallow.  She denies any fevers.  She has had some rhinorrhea and a sore throat..  She has had some diarrhea and she vomited prior to the onset of her symptoms.  Past Medical History:  Diagnosis Date  . Abnormal Pap smear   . Anemia   . Benign neoplasm of pituitary gland ([place])   . Bipolar 1 disorder, depressed ([place])    biopolar  . Depression   . Galactorrhea   . Migraine    without aura  . Nausea   . Ulcer     Patient Active Problem List   Diagnosis Date Noted  . History of cervical dysplasia 11/08/2012    Past Surgical History:  Procedure Laterality Date  . APPENDECTOMY    . CARPECTOMY WITH RADIAL STYLOIDECTOMY Right 03/02/2018   Procedure: PROXIMAL ROW CARPECTOMY RIGHT WRIST WITH POSSIBLE RADIAL STYLOIDECTOMY, POSTERIOR INTEROSSEOUS NERVE RESECTION;  Surgeon: Daryll Brod, MD;  Location: [facility];  Service: Orthopedics;  Laterality: Right;  AXILLARY BLOCK  . LEEP     leep  . TUBAL LIGATION       OB History   No obstetric history on file.     Family History  Problem Relation Age of Onset  . Hypertension Mother   . Ulcers Mother        gastric  . Diabetes Maternal Grandmother   . Cancer Maternal Grandfather     Social History   Tobacco Use  . Smoking status: Current Every Day Smoker    Packs/day: 1.00    Types: Cigarettes  . Smokeless tobacco: Never Used  .  Tobacco comment: 1 pkg a day.  Substance Use Topics  . Alcohol use: No  . Drug use: No    Home Medications Prior to Admission medications   Medication Sig Start Date End Date Taking? Authorizing Provider  acyclovir (ZOVIRAX) 400 MG tablet Take 1 tablet (400 mg total) by mouth 5 (five) times daily for 5 days. 06/05/20 06/10/20 Yes Deliana Avalos S, PA-C  ibuprofen (ADVIL,MOTRIN) 600 MG tablet Take 600 mg by mouth every 6 (six) hours as needed.   Yes [provider]  mupirocin nasal ointment (BACTROBAN) 2 % Apply in each nostril daily 06/05/20  Yes Hanifa Antonetti S, PA-C  triamcinolone (KENALOG) 0.1 % paste Use as directed 1 application in the mouth or throat 2 (two) times daily. 01/31/20   Avegno, Darrelyn Hillock, FNP    Allergies    Patient has no known allergies.  Review of Systems   Review of Systems  Constitutional: Negative for fever.  HENT: Positive for congestion, rhinorrhea, sore throat and trouble swallowing.   Eyes: Negative for visual disturbance.  Respiratory: Negative for shortness of breath.   Cardiovascular: Negative for chest pain.  Gastrointestinal: Positive for diarrhea and vomiting (resolved). Negative for abdominal pain.  Genitourinary: Negative for pelvic pain.  Musculoskeletal: Negative for back  pain.  Skin: Positive for wound.    Physical Exam Updated Vital Signs BP 103/70   Pulse 94   Temp 97.9 F (36.6 C) (Oral)   Resp 18   Ht 5\' 4"  (1.626 m)   Wt 60.3 kg   LMP 06/04/2020   SpO2 98%   BMI 22.83 kg/m   Physical Exam Vitals and nursing note reviewed.  Constitutional:      General: She is not in acute distress.    Appearance: She is well-developed and well-nourished.  HENT:     Head: Normocephalic and atraumatic.     Mouth/Throat:     Mouth: Mucous membranes are dry.     Comments: Multiple scabbed over areas noted to the right side of the mouth.  There is also a lesion to the chin and left side of the mouth Eyes:     Conjunctiva/sclera:  Conjunctivae normal.  Cardiovascular:     Rate and Rhythm: Normal rate.  Pulmonary:     Effort: Pulmonary effort is normal.  Musculoskeletal:        General: Normal range of motion.     Cervical back: Neck supple.  Skin:    General: Skin is warm and dry.  Neurological:     Mental Status: She is alert.  Psychiatric:        Mood and Affect: Mood and affect normal.     ED Results / Procedures / Treatments   Labs (all labs ordered are listed, but only abnormal results are displayed) Labs Reviewed  HSV CULTURE AND TYPING  SARS CORONAVIRUS 2 (TAT 6-24 HRS)    EKG None  Radiology No results found.  Procedures Procedures (including critical care time)  Medications Ordered in ED Medications  lidocaine (XYLOCAINE) 2 % viscous mouth solution 15 mL (has no administration in time range)    ED Course  I have reviewed the triage vital signs and the nursing notes.  Pertinent labs & imaging results that were available during my care of the patient were reviewed by me and considered in my medical decision making (see chart for details).    MDM Rules/Calculators/A&P                          40 year old female presenting for evaluation of mouth lesions noted 3 days ago.  No history of similar symptoms.  Exam clinically appears likely consistent with herpes infection.  Swabbed patient for HSV.  We will also cover her for possible impetigo with mupirocin.  We will give her Valtrex to cover for possible HSV.  She is also complaining of some URI symptoms as well so she is swab for COVID.  This is pending at the time of her discharge.  She not appear to have any emergent medical complaint at this time that would require further work-up or admission to the hospital.  She is given information to follow-up with a PCP and she is given strict return precautions.  She voices understanding of the plan and reasons to return fetal questions answered.  Patient stable for discharge.  Final Clinical  Impression(s) / ED Diagnoses Final diagnoses:  Mouth ulcer  Upper respiratory tract infection, unspecified type    Rx / DC Orders ED Discharge Orders         Ordered    acyclovir (ZOVIRAX) 400 MG tablet  5 times daily        06/05/20 2158    mupirocin nasal ointment (BACTROBAN) 2 %  06/05/20 2203           Rodney Booze, PA-C 06/05/20 2205    Drenda Freeze, MD 06/06/20 [phone]

## 2020-06-05 NOTE — Discharge Instructions (Addendum)
Take acyclovir as directed.  You were given a cream to help with the sores on your face. You can buy over-the-counter Chloraseptic to help with your throat discomfort.  Your COVID test will take 24 hours to result.  You can follow-up on the results on your MyChart.  The HSV test will take several days to result you can also follow-up with [place] on your MyChart.  You are given information follow-up with a primary care provider.  Please call the office to schedule an appointment for follow-up.  Please return to the emergency department for any new or worsening symptoms.

## 2020-06-05 NOTE — ED Triage Notes (Signed)
Mouth ulcers x 3 days.

## 2020-06-06 LAB — SARS CORONAVIRUS 2 (TAT 6-24 HRS): SARS Coronavirus 2: POSITIVE — AB

## 2020-06-08 LAB — HSV CULTURE AND TYPING

## 2020-11-19 ENCOUNTER — Other Ambulatory Visit (HOSPITAL_BASED_OUTPATIENT_CLINIC_OR_DEPARTMENT_OTHER): Payer: Self-pay

## 2020-11-19 ENCOUNTER — Encounter (HOSPITAL_BASED_OUTPATIENT_CLINIC_OR_DEPARTMENT_OTHER): Payer: Self-pay | Admitting: Emergency Medicine

## 2020-11-19 ENCOUNTER — Emergency Department (HOSPITAL_BASED_OUTPATIENT_CLINIC_OR_DEPARTMENT_OTHER): Payer: Medicare Other

## 2020-11-19 ENCOUNTER — Emergency Department (HOSPITAL_BASED_OUTPATIENT_CLINIC_OR_DEPARTMENT_OTHER)
Admission: EM | Admit: 2020-11-19 | Discharge: 2020-11-19 | Disposition: A | Discharge status: Home / Self Care | Payer: Medicare Other | Attending: Emergency Medicine | Admitting: Emergency Medicine

## 2020-11-19 ENCOUNTER — Other Ambulatory Visit: Payer: Self-pay

## 2020-11-19 DIAGNOSIS — N390 Urinary tract infection, site not specified: Secondary | ICD-10-CM | POA: Diagnosis not present

## 2020-11-19 DIAGNOSIS — N72 Inflammatory disease of cervix uteri: Secondary | ICD-10-CM | POA: Insufficient documentation

## 2020-11-19 DIAGNOSIS — F17210 Nicotine dependence, cigarettes, uncomplicated: Secondary | ICD-10-CM | POA: Diagnosis not present

## 2020-11-19 DIAGNOSIS — R1030 Lower abdominal pain, unspecified: Secondary | ICD-10-CM | POA: Diagnosis present

## 2020-11-19 DIAGNOSIS — N760 Acute vaginitis: Secondary | ICD-10-CM | POA: Insufficient documentation

## 2020-11-19 DIAGNOSIS — B9689 Other specified bacterial agents as the cause of diseases classified elsewhere: Secondary | ICD-10-CM | POA: Diagnosis not present

## 2020-11-19 DIAGNOSIS — Z86018 Personal history of other benign neoplasm: Secondary | ICD-10-CM | POA: Diagnosis not present

## 2020-11-19 LAB — COMPREHENSIVE METABOLIC PANEL
ALT: 11 U/L (ref 0–44)
AST: 15 U/L (ref 15–41)
Albumin: 4.1 g/dL (ref 3.5–5.0)
Alkaline Phosphatase: 65 U/L (ref 38–126)
Anion gap: 7 (ref 5–15)
BUN: 14 mg/dL (ref 6–20)
CO2: 25 mmol/L (ref 22–32)
Calcium: 8.6 mg/dL — ABNORMAL LOW (ref 8.9–10.3)
Chloride: 105 mmol/L (ref 98–111)
Creatinine, Ser: 0.72 mg/dL (ref 0.44–1.00)
GFR, Estimated: >60 mL/min (ref >60)
Glucose, Bld: 95 mg/dL (ref 70–99)
Potassium: 3.8 mmol/L (ref 3.5–5.1)
Sodium: 137 mmol/L (ref 135–145)
Total Bilirubin: 0.4 mg/dL (ref 0.3–1.2)
Total Protein: 6.9 g/dL (ref 6.5–8.1)

## 2020-11-19 LAB — CBC WITH DIFFERENTIAL/PLATELET
Abs Immature Granulocytes: 0.02 10*3/uL (ref 0.00–0.07)
Basophils Absolute: 0 10*3/uL (ref 0.0–0.1)
Basophils Relative: 0 %
Eosinophils Absolute: 0.2 10*3/uL (ref 0.0–0.5)
Eosinophils Relative: 2 %
HCT: 38.9 % (ref 36.0–46.0)
Hemoglobin: 13.1 g/dL (ref 12.0–15.0)
Immature Granulocytes: 0 %
Lymphocytes Relative: 17 %
Lymphs Abs: 1.6 10*3/uL (ref 0.7–4.0)
MCH: 31.2 pg (ref 26.0–34.0)
MCHC: 33.7 g/dL (ref 30.0–36.0)
MCV: 92.6 fL (ref 80.0–100.0)
Monocytes Absolute: 0.4 10*3/uL (ref 0.1–1.0)
Monocytes Relative: 4 %
Neutro Abs: 7 10*3/uL (ref 1.7–7.7)
Neutrophils Relative %: 77 %
Platelets: 281 10*3/uL (ref 150–400)
RBC: 4.2 MIL/uL (ref 3.87–5.11)
RDW: 15.7 % — ABNORMAL HIGH (ref 11.5–15.5)
WBC: 9.3 10*3/uL (ref 4.0–10.5)
nRBC: 0 % (ref 0.0–0.2)

## 2020-11-19 LAB — WET PREP, GENITAL
Sperm: NONE SEEN
Trich, Wet Prep: NONE SEEN
Yeast Wet Prep HPF POC: NONE SEEN

## 2020-11-19 LAB — URINALYSIS, ROUTINE W REFLEX MICROSCOPIC
Bilirubin Urine: NEGATIVE
Glucose, UA: NEGATIVE mg/dL
Ketones, ur: NEGATIVE mg/dL
Nitrite: NEGATIVE
Protein, ur: NEGATIVE mg/dL
Specific Gravity, Urine: 1.02 (ref 1.005–1.030)
pH: 7 (ref 5.0–8.0)

## 2020-11-19 LAB — URINALYSIS, MICROSCOPIC (REFLEX)

## 2020-11-19 LAB — PREGNANCY, URINE: Preg Test, Ur: NEGATIVE

## 2020-11-19 MED ORDER — ONDANSETRON HCL 4 MG/2ML IJ SOLN
4.0000 mg | Freq: Once | INTRAMUSCULAR | Status: AC
  Administered 2020-11-19: 4 mg via INTRAVENOUS
  Filled 2020-11-19: qty 2

## 2020-11-19 MED ORDER — DOXYCYCLINE HYCLATE 100 MG PO CAPS
100.0000 mg | ORAL_CAPSULE | Freq: Two times a day (BID) | ORAL | 0 refills | Status: DC
  Filled 2020-11-19: qty 20, 10d supply, fill #0

## 2020-11-19 MED ORDER — DOXYCYCLINE HYCLATE 100 MG PO CAPS: 100.0000 mg | ORAL_CAPSULE | Freq: Two times a day (BID) | ORAL | 0 refills | Status: DC

## 2020-11-19 MED ORDER — METRONIDAZOLE 500 MG PO TABS: 500.0000 mg | ORAL_TABLET | Freq: Two times a day (BID) | ORAL | 0 refills | Status: DC

## 2020-11-19 MED ORDER — SODIUM CHLORIDE 0.9 % IV SOLN
1.0000 g | Freq: Once | INTRAVENOUS | Status: AC
  Administered 2020-11-19: 1 g via INTRAVENOUS
  Filled 2020-11-19: qty 10

## 2020-11-19 MED ORDER — SODIUM CHLORIDE 0.9 % IV BOLUS
1000.0000 mL | Freq: Once | INTRAVENOUS | Status: AC
  Administered 2020-11-19: 1000 mL via INTRAVENOUS

## 2020-11-19 MED ORDER — HYDROMORPHONE HCL 1 MG/ML IJ SOLN
1.0000 mg | Freq: Once | INTRAMUSCULAR | Status: AC
  Administered 2020-11-19: 1 mg via INTRAVENOUS
  Filled 2020-11-19: qty 1

## 2020-11-19 MED ORDER — METRONIDAZOLE 500 MG PO TABS
500.0000 mg | ORAL_TABLET | Freq: Two times a day (BID) | ORAL | 0 refills | Status: AC
  Filled 2020-11-19: qty 20, 10d supply, fill #0

## 2020-11-19 NOTE — ED Notes (Signed)
ED Provider at bedside. 

## 2020-11-19 NOTE — ED Triage Notes (Signed)
Pt reports abdominal, back pain, and vaginal bleeding that started 2 days ago. Pt states she had positive pregnancy test in April and denies prenatal care.

## 2020-11-19 NOTE — ED Notes (Signed)
Pt. States she is not driving and has a ride home.

## 2020-11-19 NOTE — ED Provider Notes (Addendum)
[facility] EMERGENCY DEPARTMENT Provider Note   CSN: [id number] Arrival date & time: 11/19/20  9629     History Chief Complaint  Patient presents with   Abdominal Pain    Stephen Turnbaugh is a 40 y.o. female.  The history is provided by the patient. No language interpreter was used.  Abdominal Pain Pain location:  Suprapubic Pain quality: aching and cramping   Pain radiates to:  Does not radiate Onset quality:  Gradual Timing:  Constant Progression:  Worsening Chronicity:  New Relieved by:  Nothing Worsened by:  Nothing Ineffective treatments:  None tried Associated symptoms: anorexia   Risk factors: pregnancy   Pt reports she had a positive pregnancy test.  Pt reports some spotting.      Past Medical History:  Diagnosis Date   Abnormal Pap smear    Anemia    Benign neoplasm of pituitary gland (HCC)    Bipolar 1 disorder, depressed ([place])    biopolar   Depression    Galactorrhea    Migraine    without aura   Nausea    Ulcer     Patient Active Problem List   Diagnosis Date Noted   History of cervical dysplasia 11/08/2012    Past Surgical History:  Procedure Laterality Date   APPENDECTOMY     CARPECTOMY WITH RADIAL STYLOIDECTOMY Right 03/02/2018   Procedure: PROXIMAL ROW CARPECTOMY RIGHT WRIST WITH POSSIBLE RADIAL STYLOIDECTOMY, POSTERIOR INTEROSSEOUS NERVE RESECTION;  Surgeon: Daryll Brod, MD;  Location: [facility];  Service: Orthopedics;  Laterality: Right;  AXILLARY BLOCK   LEEP     leep   TUBAL LIGATION       OB History   No obstetric history on file.     Family History  Problem Relation Age of Onset   Hypertension Mother    Ulcers Mother        gastric   Diabetes Maternal Grandmother    Cancer Maternal Grandfather     Social History   Tobacco Use   Smoking status: Every Day    Packs/day: 1.00    Pack years: 0.00    Types: Cigarettes   Smokeless tobacco: Never   Tobacco comments:    1 pkg a day.  Substance  Use Topics   Alcohol use: No   Drug use: No    Home Medications Prior to Admission medications   Medication Sig Start Date End Date Taking? Authorizing Provider  ibuprofen (ADVIL,MOTRIN) 600 MG tablet Take 600 mg by mouth every 6 (six) hours as needed.    [provider]  mupirocin nasal ointment (BACTROBAN) 2 % Apply in each nostril daily 06/05/20   Couture, Cortni S, PA-C  triamcinolone (KENALOG) 0.1 % paste Use as directed 1 application in the mouth or throat 2 (two) times daily. 01/31/20   Avegno, Darrelyn Hillock, FNP    Allergies    Patient has no known allergies.  Review of Systems   Review of Systems  Gastrointestinal:  Positive for abdominal pain and anorexia.  All other systems reviewed and are negative.  Physical Exam Updated Vital Signs BP 110/78 (BP Location: Right Arm)   Pulse (!) 54   Temp 97.6 F (36.4 C) (Oral)   Resp 14   Ht 5\' 4"  (1.626 m)   Wt 60.3 kg   LMP 09/14/2020   SpO2 100%   BMI 22.83 kg/m   Physical Exam Vitals and nursing note reviewed.  Constitutional:      Appearance: She is well-developed.  HENT:     Head: Normocephalic.  Cardiovascular:     Rate and Rhythm: Normal rate and regular rhythm.  Pulmonary:     Effort: Pulmonary effort is normal.  Abdominal:     General: Bowel sounds are normal. There is no distension.     Palpations: Abdomen is soft.     Tenderness: There is generalized abdominal tenderness.  Genitourinary:    Cervix: Discharge present.     Uterus: Normal.      Adnexa: Right adnexa normal and left adnexa normal.  Musculoskeletal:        General: Normal range of motion.     Cervical back: Normal range of motion.  Skin:    General: Skin is warm.  Neurological:     Mental Status: She is alert and oriented to person, place, and time.    ED Results / Procedures / Treatments   Labs (all labs ordered are listed, but only abnormal results are displayed) Labs Reviewed  WET PREP, GENITAL - Abnormal; Notable for the  following components:      Result Value   Clue Cells Wet Prep HPF POC PRESENT (*)    WBC, Wet Prep HPF POC MODERATE (*)    All other components within normal limits  URINALYSIS, ROUTINE W REFLEX MICROSCOPIC - Abnormal; Notable for the following components:   Hgb urine dipstick SMALL (*)    Leukocytes,Ua LARGE (*)    All other components within normal limits  URINALYSIS, MICROSCOPIC (REFLEX) - Abnormal; Notable for the following components:   Bacteria, UA MANY (*)    All other components within normal limits  CBC WITH DIFFERENTIAL/PLATELET - Abnormal; Notable for the following components:   RDW 15.7 (*)    All other components within normal limits  COMPREHENSIVE METABOLIC PANEL - Abnormal; Notable for the following components:   Calcium 8.6 (*)    All other components within normal limits  PREGNANCY, URINE  GC/CHLAMYDIA PROBE AMP ([place]) NOT AT [facility]    EKG None  Radiology US PELVIS TRANSVAGINAL NON-OB (TV ONLY)  Result Date: 11/19/2020 CLINICAL DATA:  Pelvic pain, negative urine test but patient indicates multiple positive home pregnancy tests; LMP 09/14/2020 EXAM: ULTRASOUND PELVIS TRANSVAGINAL TECHNIQUE: Transvaginal ultrasound examination of the pelvis was performed including evaluation of the uterus, ovaries, adnexal regions, and pelvic cul-de-sac. COMPARISON:  02/29/2020 FINDINGS: Uterus Measurements: 7.5 x 4.1 x 5.5 cm = volume: 87.7 mL. Anteverted. Slightly heterogeneous myometrium. No focal mass. Endometrium Thickness: 8 mm. No endometrial fluid or mass. No gestational sac identified. Right ovary Measurements: 3.7 x 2.6 x 2.8 cm = volume: 13.7 mL. Normal morphology without mass Left ovary Measurements: 3.2 x 2.0 x 1.8 cm = volume: 5.9 mL. Normal morphology without mass Other findings:  No free pelvic fluid.  No adnexal masses. IMPRESSION: No pelvic sonographic abnormalities. Electronically Signed   By: Lavonia Dana M.D.   On: 11/19/2020 12:45    Procedures Procedures    Medications Ordered in ED Medications  cefTRIAXone (ROCEPHIN) 1 g in sodium chloride 0.9 % 100 mL IVPB (has no administration in time range)  sodium chloride 0.9 % bolus 1,000 mL (1,000 mLs Intravenous New Bag/Given 11/19/20 1127)  ondansetron (ZOFRAN) injection 4 mg (4 mg Intravenous Given 11/19/20 1128)  HYDROmorphone (DILAUDID) injection 1 mg (1 mg Intravenous Given 11/19/20 1246)    ED Course  I have reviewed the triage vital signs and the nursing notes.  Pertinent labs & imaging results that were available during my care of the  patient were reviewed by me and considered in my medical decision making (see chart for details).    MDM Rules/Calculators/A&P                         MDM:  pregnancy test negative.  Ultrasound no acute abnormality,  wbc count is normal  Wet prep shows clue cells  and bacteria  UA shows greater than 20 wbc's  Pt given Rocephin 1 gram.  Rx for flagyl and doxycycline   Final Clinical Impression(s) / ED Diagnoses Final diagnoses:  Urinary tract infection without hematuria, site unspecified  Cervicitis  Bacterial vaginitis    Rx / DC Orders ED Discharge Orders          Ordered    doxycycline (VIBRAMYCIN) 100 MG capsule  2 times daily        11/19/20 1318    metroNIDAZOLE (FLAGYL) 500 MG tablet  2 times daily        11/19/20 1318          An After Visit Summary was printed and given to the patient.    Fransico Meadow, PA-C 11/19/20 1319    Fransico Meadow, [place] 11/19/20 1401    Lucrezia Starch, MD 11/19/20 [phone]

## 2020-11-19 NOTE — Discharge Instructions (Addendum)
See your Physician for recheck.  °

## 2020-11-20 LAB — GC/CHLAMYDIA PROBE AMP (~~LOC~~) NOT AT ARMC
Chlamydia: NEGATIVE
Comment: NEGATIVE
Comment: NORMAL
Neisseria Gonorrhea: NEGATIVE

## 2020-12-16 ENCOUNTER — Emergency Department (HOSPITAL_BASED_OUTPATIENT_CLINIC_OR_DEPARTMENT_OTHER)
Admission: EM | Admit: 2020-12-16 | Discharge: 2020-12-16 | Disposition: A | Discharge status: Home / Self Care | Payer: BC Managed Care – PPO | Attending: Emergency Medicine | Admitting: Emergency Medicine

## 2020-12-16 ENCOUNTER — Encounter (HOSPITAL_BASED_OUTPATIENT_CLINIC_OR_DEPARTMENT_OTHER): Payer: Self-pay | Admitting: *Deleted

## 2020-12-16 ENCOUNTER — Other Ambulatory Visit: Payer: Self-pay

## 2020-12-16 ENCOUNTER — Other Ambulatory Visit (HOSPITAL_BASED_OUTPATIENT_CLINIC_OR_DEPARTMENT_OTHER): Payer: Self-pay

## 2020-12-16 DIAGNOSIS — M5432 Sciatica, left side: Secondary | ICD-10-CM

## 2020-12-16 DIAGNOSIS — M5442 Lumbago with sciatica, left side: Secondary | ICD-10-CM | POA: Insufficient documentation

## 2020-12-16 DIAGNOSIS — R202 Paresthesia of skin: Secondary | ICD-10-CM | POA: Diagnosis not present

## 2020-12-16 DIAGNOSIS — F17210 Nicotine dependence, cigarettes, uncomplicated: Secondary | ICD-10-CM | POA: Diagnosis not present

## 2020-12-16 MED ORDER — CYCLOBENZAPRINE HCL 10 MG PO TABS
10.0000 mg | ORAL_TABLET | Freq: Two times a day (BID) | ORAL | 0 refills | Status: DC | PRN
  Filled 2020-12-16: qty 12, 6d supply, fill #0

## 2020-12-16 MED ORDER — METHYLPREDNISOLONE 4 MG PO TBPK
ORAL_TABLET | ORAL | 0 refills | Status: DC
  Filled 2020-12-16: qty 21, 6d supply, fill #0

## 2020-12-16 NOTE — Discharge Instructions (Addendum)
Take the steroid as prescribed.  Use the muscle relaxers intermittently for muscle spasms to help with your symptoms.  Continue taking anti-inflammatory such as Aleve or Motrin or ibuprofen at home.  Can also take Tylenol.  Perform the stretches as prescribed.  You develop any difficulties with urinating, inability to control your bowel or bladder, worsening numbness or tingling, fever or chills return to the ER.  Follow-up 1 week with your primary care doctor for further management if symptoms not improved.

## 2020-12-16 NOTE — ED Provider Notes (Signed)
[facility] Provider Note   CSN: [id number] Arrival date & time: 12/16/20  1306     History Chief Complaint  Patient presents with   Back Pain    Cozetta Mcclees is a 40 y.o. female.  HPI 40 year old female presents to the emergency department today for evaluation of low back pain and numbness and tingling down her left leg.  Patient reports that symptoms began several days ago and progressively gotten worse.  Patient denies any loss of bowel or bladder, saddle paresthesias, urinary retention.  No history of IV drug use or cancer.  No fever, chills, night sweat or weight loss.  Patient has taken ibuprofen for the pain without any significant relief.  Patient denies any injury to the area.  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  Patient reports numbness over the left side of her lateral calf.  Denies any swelling to the area.  She is able to ambulate with some pain to her lower back.    Past Medical History:  Diagnosis Date   Abnormal Pap smear    Anemia    Benign neoplasm of pituitary gland (HCC)    Bipolar 1 disorder, depressed ([place])    biopolar   Depression    Galactorrhea    Migraine    without aura   Nausea    Ulcer     Patient Active Problem List   Diagnosis Date Noted   History of cervical dysplasia 11/08/2012    Past Surgical History:  Procedure Laterality Date   APPENDECTOMY     CARPECTOMY WITH RADIAL STYLOIDECTOMY Right 03/02/2018   Procedure: PROXIMAL ROW CARPECTOMY RIGHT WRIST WITH POSSIBLE RADIAL STYLOIDECTOMY, POSTERIOR INTEROSSEOUS NERVE RESECTION;  Surgeon: Daryll Brod, MD;  Location: [facility];  Service: Orthopedics;  Laterality: Right;  AXILLARY BLOCK   LEEP     leep   TUBAL LIGATION       OB History   No obstetric history on file.     Family History  Problem Relation Age of Onset   Hypertension Mother    Ulcers Mother        gastric   Diabetes Maternal Grandmother    Cancer  Maternal Grandfather     Social History   Tobacco Use   Smoking status: Every Day    Packs/day: 1.00    Types: Cigarettes   Smokeless tobacco: Never   Tobacco comments:    1 pkg a day.  Vaping Use   Vaping Use: Some days  Substance Use Topics   Alcohol use: No   Drug use: No    Home Medications Prior to Admission medications   Medication Sig Start Date End Date Taking? Authorizing Provider  cyclobenzaprine (FLEXERIL) 10 MG tablet Take 1 tablet (10 mg total) by mouth 2 (two) times daily as needed for muscle spasms. 12/16/20  Yes Doristine Devoid, PA-C  methylPREDNISolone (MEDROL DOSEPAK) 4 MG TBPK tablet Take as directed by mouth, see back of pack for directions 12/16/20  Yes Akai Dollard, Zack Seal, PA-C  doxycycline (VIBRAMYCIN) 100 MG capsule Take 1 capsule (100 mg total) by mouth 2 (two) times daily. 11/19/20   Fransico Meadow, PA-C  ibuprofen (ADVIL,MOTRIN) 600 MG tablet Take 600 mg by mouth every 6 (six) hours as needed.    [provider]  mupirocin nasal ointment (BACTROBAN) 2 % Apply in each nostril daily 06/05/20   Couture, Cortni S, PA-C  triamcinolone (KENALOG) 0.1 % paste Use as directed 1  application in the mouth or throat 2 (two) times daily. 01/31/20   Avegno, Darrelyn Hillock, FNP    Allergies    Patient has no known allergies.  Review of Systems   Review of Systems  Constitutional:  Negative for chills and fever.  HENT:  Negative for congestion.   Eyes:  Negative for discharge.  Genitourinary:  Negative for difficulty urinating.  Musculoskeletal:  Positive for arthralgias, back pain and myalgias.  Skin:  Negative for color change.  Neurological:  Negative for weakness and numbness.  Psychiatric/Behavioral:  Negative for confusion.    Physical Exam Updated Vital Signs BP 113/73   Pulse 78   Temp 98.5 F (36.9 C) (Oral)   Resp 16   Ht '5\' 4"'$  (1.626 m)   Wt 59 kg   LMP 12/12/2020   SpO2 99%   BMI 22.31 kg/m   Physical Exam Vitals and nursing note  reviewed.  Constitutional:      General: She is not in acute distress.    Appearance: She is well-developed. She is not ill-appearing or toxic-appearing.  HENT:     Head: Normocephalic and atraumatic.     Mouth/Throat:     Mouth: Mucous membranes are moist.     Pharynx: Oropharynx is clear.  Eyes:     General: No scleral icterus.       Right eye: No discharge.        Left eye: No discharge.  Pulmonary:     Effort: No respiratory distress.  Musculoskeletal:        General: Normal range of motion.     Cervical back: Normal range of motion.     Comments: Patient with pain that radiates down the left buttocks as the sciatic nerve distribution.  No rashes appreciated.  Skin:    General: Skin is warm.     Capillary Refill: Capillary refill takes less than 2 seconds.     Coloration: Skin is not pale.  Neurological:     Mental Status: She is alert.     Sensory: No sensory deficit.     Motor: No weakness.     Comments: Strength out of 5 in lower extremities bilaterally.   Psychiatric:        Mood and Affect: Mood normal.        Behavior: Behavior normal.        Thought Content: Thought content normal.        Judgment: Judgment normal.    ED Results / Procedures / Treatments   Labs (all labs ordered are listed, but only abnormal results are displayed) Labs Reviewed - No data to display  EKG None  Radiology No results found.  Procedures Procedures   Medications Ordered in ED Medications - No data to display  ED Course  I have reviewed the triage vital signs and the nursing notes.  Pertinent labs & imaging results that were available during my care of the patient were reviewed by me and considered in my medical decision making (see chart for details).    MDM Rules/Calculators/A&P                           Patient with back pain.  No neurological deficits and normal neuro exam.  Patient can walk but states is painful.  No loss of bowel or bladder control.  No concern  for cauda equina.  No fever, night sweats, weight loss, h/o cancer, IVDU.  Symptoms seem consistent  with sciatic nerve.  Patient started on steroids and muscle relaxers.  RICE protocol and pain medicine indicated and discussed with patient.   Final Clinical Impression(s) / ED Diagnoses Final diagnoses:  Sciatica of left side    Rx / DC Orders ED Discharge Orders          Ordered    methylPREDNISolone (MEDROL DOSEPAK) 4 MG TBPK tablet        12/16/20 1412    cyclobenzaprine (FLEXERIL) 10 MG tablet  2 times daily PRN        12/16/20 1412             Doristine Devoid, PA-C 12/16/20 1418    Sherwood Gambler, MD 12/18/20 [phone]

## 2020-12-16 NOTE — ED Triage Notes (Signed)
C/o lower left back pain which radiates down left leg also c/o numbness x 1 week

## 2021-02-05 ENCOUNTER — Encounter (HOSPITAL_BASED_OUTPATIENT_CLINIC_OR_DEPARTMENT_OTHER): Payer: Self-pay | Admitting: *Deleted

## 2021-02-05 ENCOUNTER — Other Ambulatory Visit: Payer: Self-pay

## 2021-02-05 ENCOUNTER — Emergency Department (HOSPITAL_BASED_OUTPATIENT_CLINIC_OR_DEPARTMENT_OTHER)
Admission: EM | Admit: 2021-02-05 | Discharge: 2021-02-05 | Disposition: A | Discharge status: Home / Self Care | Payer: BC Managed Care – PPO | Attending: Emergency Medicine | Admitting: Emergency Medicine

## 2021-02-05 DIAGNOSIS — R519 Headache, unspecified: Secondary | ICD-10-CM | POA: Insufficient documentation

## 2021-02-05 DIAGNOSIS — M791 Myalgia, unspecified site: Secondary | ICD-10-CM | POA: Insufficient documentation

## 2021-02-05 DIAGNOSIS — R112 Nausea with vomiting, unspecified: Secondary | ICD-10-CM | POA: Insufficient documentation

## 2021-02-05 DIAGNOSIS — D72829 Elevated white blood cell count, unspecified: Secondary | ICD-10-CM | POA: Insufficient documentation

## 2021-02-05 DIAGNOSIS — R5383 Other fatigue: Secondary | ICD-10-CM | POA: Insufficient documentation

## 2021-02-05 DIAGNOSIS — Z20822 Contact with and (suspected) exposure to covid-19: Secondary | ICD-10-CM | POA: Insufficient documentation

## 2021-02-05 DIAGNOSIS — F17210 Nicotine dependence, cigarettes, uncomplicated: Secondary | ICD-10-CM | POA: Insufficient documentation

## 2021-02-05 DIAGNOSIS — H53149 Visual discomfort, unspecified: Secondary | ICD-10-CM | POA: Diagnosis not present

## 2021-02-05 DIAGNOSIS — R197 Diarrhea, unspecified: Secondary | ICD-10-CM | POA: Diagnosis not present

## 2021-02-05 LAB — COMPREHENSIVE METABOLIC PANEL
ALT: 17 U/L (ref 0–44)
AST: 24 U/L (ref 15–41)
Albumin: 4.3 g/dL (ref 3.5–5.0)
Alkaline Phosphatase: 64 U/L (ref 38–126)
Anion gap: 9 (ref 5–15)
BUN: 13 mg/dL (ref 6–20)
CO2: 24 mmol/L (ref 22–32)
Calcium: 8.9 mg/dL (ref 8.9–10.3)
Chloride: 104 mmol/L (ref 98–111)
Creatinine, Ser: 0.69 mg/dL (ref 0.44–1.00)
GFR, Estimated: >60 mL/min (ref >60)
Glucose, Bld: 93 mg/dL (ref 70–99)
Potassium: 3.3 mmol/L — ABNORMAL LOW (ref 3.5–5.1)
Sodium: 137 mmol/L (ref 135–145)
Total Bilirubin: 0.5 mg/dL (ref 0.3–1.2)
Total Protein: 7.4 g/dL (ref 6.5–8.1)

## 2021-02-05 LAB — CBC WITH DIFFERENTIAL/PLATELET
Abs Immature Granulocytes: 0.04 10*3/uL (ref 0.00–0.07)
Basophils Absolute: 0 10*3/uL (ref 0.0–0.1)
Basophils Relative: 0 %
Eosinophils Absolute: 0.1 10*3/uL (ref 0.0–0.5)
Eosinophils Relative: 1 %
HCT: 36.4 % (ref 36.0–46.0)
Hemoglobin: 12.5 g/dL (ref 12.0–15.0)
Immature Granulocytes: 0 %
Lymphocytes Relative: 18 %
Lymphs Abs: 2.3 10*3/uL (ref 0.7–4.0)
MCH: 31.3 pg (ref 26.0–34.0)
MCHC: 34.3 g/dL (ref 30.0–36.0)
MCV: 91.2 fL (ref 80.0–100.0)
Monocytes Absolute: 0.7 10*3/uL (ref 0.1–1.0)
Monocytes Relative: 6 %
Neutro Abs: 9.4 10*3/uL — ABNORMAL HIGH (ref 1.7–7.7)
Neutrophils Relative %: 75 %
Platelets: 298 10*3/uL (ref 150–400)
RBC: 3.99 MIL/uL (ref 3.87–5.11)
RDW: 15.3 % (ref 11.5–15.5)
WBC: 12.6 10*3/uL — ABNORMAL HIGH (ref 4.0–10.5)
nRBC: 0 % (ref 0.0–0.2)

## 2021-02-05 LAB — URINALYSIS, ROUTINE W REFLEX MICROSCOPIC
Bilirubin Urine: NEGATIVE
Glucose, UA: NEGATIVE mg/dL
Ketones, ur: 5 mg/dL — AB
Leukocytes,Ua: NEGATIVE
Nitrite: NEGATIVE
Protein, ur: NEGATIVE mg/dL
Specific Gravity, Urine: 1.03 (ref 1.005–1.030)
pH: 6 (ref 5.0–8.0)

## 2021-02-05 LAB — URINALYSIS, MICROSCOPIC (REFLEX)

## 2021-02-05 LAB — PREGNANCY, URINE: Preg Test, Ur: NEGATIVE

## 2021-02-05 LAB — LIPASE, BLOOD: Lipase: 24 U/L (ref 11–51)

## 2021-02-05 LAB — RESP PANEL BY RT-PCR (FLU A&B, COVID) ARPGX2
Influenza A by PCR: NEGATIVE
Influenza B by PCR: NEGATIVE
SARS Coronavirus 2 by RT PCR: NEGATIVE

## 2021-02-05 MED ORDER — SODIUM CHLORIDE 0.9 % IV BOLUS
1000.0000 mL | Freq: Once | INTRAVENOUS | Status: AC
  Administered 2021-02-05: 1000 mL via INTRAVENOUS

## 2021-02-05 MED ORDER — KETOROLAC TROMETHAMINE 30 MG/ML IJ SOLN
15.0000 mg | Freq: Once | INTRAMUSCULAR | Status: AC
  Administered 2021-02-05: 15 mg via INTRAVENOUS
  Filled 2021-02-05: qty 1

## 2021-02-05 MED ORDER — DEXAMETHASONE SODIUM PHOSPHATE 4 MG/ML IJ SOLN
4.0000 mg | Freq: Once | INTRAMUSCULAR | Status: AC
  Administered 2021-02-05: 4 mg via INTRAVENOUS
  Filled 2021-02-05: qty 1

## 2021-02-05 MED ORDER — DIPHENHYDRAMINE HCL 25 MG PO CAPS
25.0000 mg | ORAL_CAPSULE | Freq: Once | ORAL | Status: AC
  Administered 2021-02-05: 25 mg via ORAL
  Filled 2021-02-05: qty 1

## 2021-02-05 MED ORDER — POTASSIUM CHLORIDE CRYS ER 20 MEQ PO TBCR: 40.0000 meq | EXTENDED_RELEASE_TABLET | Freq: Once | ORAL | Status: DC

## 2021-02-05 MED ORDER — METOCLOPRAMIDE HCL 10 MG PO TABS: 10.0000 mg | ORAL_TABLET | Freq: Four times a day (QID) | ORAL | 0 refills | Status: DC

## 2021-02-05 MED ORDER — METOCLOPRAMIDE HCL 5 MG/ML IJ SOLN
10.0000 mg | Freq: Once | INTRAMUSCULAR | Status: AC
  Administered 2021-02-05: 10 mg via INTRAVENOUS
  Filled 2021-02-05: qty 2

## 2021-02-05 NOTE — ED Notes (Signed)
Pt was standing outside her exam room and stated "I have to leave and cannot wait any longer" IV was discontinued, AVS for DC to home was obtained and reviewed with client, copy of AVS and work note was provided as well.

## 2021-02-05 NOTE — Discharge Instructions (Addendum)
Please drink plenty of water  Follow up with your primary care provider. I have written you a prescription for Reglan take dose of Benadryl when you need to take it.  Take it for headaches.  Please use Tylenol or ibuprofen for pain.  You may use 600 mg ibuprofen every 6 hours or 1000 mg of Tylenol every 6 hours.  You may choose to alternate between the 2.  This would be most effective.  Not to exceed 4 g of Tylenol within 24 hours.  Not to exceed 3200 mg ibuprofen 24 hours.

## 2021-02-05 NOTE — ED Provider Notes (Signed)
[facility] Provider Note   CSN: [id number] Arrival date & time: 02/05/21  1402     History Chief Complaint  Patient presents with   Vomiting    Kathryn Mcneil is a 40 y.o. female.  HPI Patient is a 40 year old female with past medical history significant for bipolar, depression, migraines without aura, nausea, or peptic ulcer, anemia  Kathryn Mcneil is a 40 y.o. female who presents today for a headache. She describes the headache as a band-like tightness around the front of her head. Associated with photophobia, nausea and vomiting. Last emesis was last night. Pt states she is unable to eat due to the nausea and has difficulty sleeping due to pain. No inciting incident or head trauma. She has taken ibuprofen and Excedrin with little relief. Hx of migraine headaches, most recent migraine one month ago. Denies shortness of breath or chest pain.  She denies any head trauma no worsening of symptoms in the morning.  She denies any positional worsening of symptoms at all.  Denies any vision changes or double vision.  She does endorse some photophobia however.     Past Medical History:  Diagnosis Date   Abnormal Pap smear    Anemia    Benign neoplasm of pituitary gland (HCC)    Bipolar 1 disorder, depressed ([place])    biopolar   Depression    Galactorrhea    Migraine    without aura   Nausea    Ulcer     Patient Active Problem List   Diagnosis Date Noted   History of cervical dysplasia 11/08/2012    Past Surgical History:  Procedure Laterality Date   APPENDECTOMY     CARPECTOMY WITH RADIAL STYLOIDECTOMY Right 03/02/2018   Procedure: PROXIMAL ROW CARPECTOMY RIGHT WRIST WITH POSSIBLE RADIAL STYLOIDECTOMY, POSTERIOR INTEROSSEOUS NERVE RESECTION;  Surgeon: Daryll Brod, MD;  Location: [facility];  Service: Orthopedics;  Laterality: Right;  AXILLARY BLOCK   LEEP     leep   TUBAL LIGATION       OB History   No obstetric history  on file.     Family History  Problem Relation Age of Onset   Hypertension Mother    Ulcers Mother        gastric   Diabetes Maternal Grandmother    Cancer Maternal Grandfather     Social History   Tobacco Use   Smoking status: Every Day    Packs/day: 1.00    Types: Cigarettes   Smokeless tobacco: Never   Tobacco comments:    1 pkg a day.  Vaping Use   Vaping Use: Some days  Substance Use Topics   Alcohol use: No   Drug use: No    Home Medications Prior to Admission medications   Medication Sig Start Date End Date Taking? Authorizing Provider  metoCLOPramide (REGLAN) 10 MG tablet Take 1 tablet (10 mg total) by mouth every 6 (six) hours. 02/05/21  Yes Lynanne Delgreco S, PA  cyclobenzaprine (FLEXERIL) 10 MG tablet Take 1 tablet (10 mg total) by mouth 2 (two) times daily as needed for muscle spasms. 12/16/20   Doristine Devoid, PA-C  doxycycline (VIBRAMYCIN) 100 MG capsule Take 1 capsule (100 mg total) by mouth 2 (two) times daily. 11/19/20   Fransico Meadow, PA-C  ibuprofen (ADVIL,MOTRIN) 600 MG tablet Take 600 mg by mouth every 6 (six) hours as needed.    [provider]  methylPREDNISolone (MEDROL DOSEPAK) 4 MG TBPK tablet Take as  directed by mouth, see back of pack for directions 12/16/20   Ocie Cornfield T, PA-C  mupirocin nasal ointment (BACTROBAN) 2 % Apply in each nostril daily 06/05/20   Couture, Cortni S, PA-C  triamcinolone (KENALOG) 0.1 % paste Use as directed 1 application in the mouth or throat 2 (two) times daily. 01/31/20   Avegno, Darrelyn Hillock, FNP    Allergies    Patient has no known allergies.  Review of Systems   Review of Systems  Constitutional:  Negative for chills and fever.  HENT:  Negative for congestion.   Eyes:  Negative for pain.  Respiratory:  Negative for cough and shortness of breath.   Cardiovascular:  Negative for chest pain and leg swelling.  Gastrointestinal:  Positive for nausea. Negative for abdominal pain and vomiting.   Genitourinary:  Negative for dysuria.  Musculoskeletal:  Negative for myalgias.  Skin:  Negative for rash.  Neurological:  Positive for headaches. Negative for dizziness.   Physical Exam Updated Vital Signs BP 90/70   Pulse 73   Temp 98.2 F (36.8 C) (Oral)   Resp 14   Ht 5\' 4"  (1.626 m)   Wt 59 kg   LMP 12/28/2020   SpO2 100%   BMI 22.31 kg/m   Physical Exam Vitals and nursing note reviewed.  Constitutional:      General: She is not in acute distress.    Comments: Uncomfortable appearing 40 year old female in no acute distress  HENT:     Head: Normocephalic and atraumatic.     Nose: Nose normal.     Mouth/Throat:     Mouth: Mucous membranes are moist.  Eyes:     General: No scleral icterus. Cardiovascular:     Rate and Rhythm: Normal rate and regular rhythm.     Pulses: Normal pulses.     Heart sounds: Normal heart sounds.  Pulmonary:     Effort: Pulmonary effort is normal. No respiratory distress.     Breath sounds: No wheezing.  Abdominal:     Palpations: Abdomen is soft.     Tenderness: There is no abdominal tenderness. There is no guarding or rebound.  Musculoskeletal:     Cervical back: Normal range of motion.     Right lower leg: No edema.     Left lower leg: No edema.  Skin:    General: Skin is warm and dry.     Capillary Refill: Capillary refill takes less than 2 seconds.  Neurological:     Mental Status: She is alert. Mental status is at baseline.     Comments: Alert and oriented to self, place, time and event.   Speech is fluent, clear without dysarthria or dysphasia.   Strength 5/5 in upper/lower extremities   Sensation intact in upper/lower extremities   CN I not tested  CN II grossly intact visual fields bilaterally. Did not visualize posterior eye.  CN III, IV, VI PERRLA and EOMs intact bilaterally  CN V Intact sensation to sharp and light touch to the face  CN VII facial movements symmetric  CN VIII not tested  CN IX, X no uvula  deviation, symmetric rise of soft palate  CN XI 5/5 SCM and trapezius strength bilaterally  CN XII Midline tongue protrusion, symmetric L/R movements     Psychiatric:        Mood and Affect: Mood normal.        Behavior: Behavior normal.    ED Results / Procedures / Treatments   Labs (all  labs ordered are listed, but only abnormal results are displayed) Labs Reviewed  URINALYSIS, ROUTINE W REFLEX MICROSCOPIC - Abnormal; Notable for the following components:      Result Value   APPearance TURBID (*)    Hgb urine dipstick LARGE (*)    Ketones, ur 5 (*)    All other components within normal limits  URINALYSIS, MICROSCOPIC (REFLEX) - Abnormal; Notable for the following components:   Bacteria, UA MANY (*)    All other components within normal limits  CBC WITH DIFFERENTIAL/PLATELET - Abnormal; Notable for the following components:   WBC 12.6 (*)    Neutro Abs 9.4 (*)    All other components within normal limits  COMPREHENSIVE METABOLIC PANEL - Abnormal; Notable for the following components:   Potassium 3.3 (*)    All other components within normal limits  RESP PANEL BY RT-PCR (FLU A&B, COVID) ARPGX2  PREGNANCY, URINE  LIPASE, BLOOD    EKG None  Radiology No results found.  Procedures Procedures   Medications Ordered in ED Medications  potassium chloride SA (KLOR-CON) CR tablet 40 mEq (has no administration in time range)  metoCLOPramide (REGLAN) injection 10 mg (10 mg Intravenous Given 02/05/21 1705)  diphenhydrAMINE (BENADRYL) capsule 25 mg (25 mg Oral Given 02/05/21 1705)  dexamethasone (DECADRON) injection 4 mg (4 mg Intravenous Given 02/05/21 1705)  ketorolac (TORADOL) 30 MG/ML injection 15 mg (15 mg Intravenous Given 02/05/21 1705)  sodium chloride 0.9 % bolus 1,000 mL (1,000 mLs Intravenous New Bag/Given 02/05/21 1711)    ED Course  I have reviewed the triage vital signs and the nursing notes.  Pertinent labs & imaging results that were available during my care of  the patient were reviewed by me and considered in my medical decision making (see chart for details).  Clinical Course as of 02/05/21 1837  Thu Feb 05, 2021  1632 HA 3 days frontal w photophobia, dizzy, NV.  Last emesis last PM NBMB.   [WF]    Clinical Course User Index [WF] Tedd Sias, [place]   MDM Rules/Calculators/A&P                           Patient is a 40 year old female with past medical history detailed in HPI presented to the ER today with complaints of nausea, vomiting, diarrhea, headaches, body aches, fatigue and frontal headache.  Denies any cough congestion fevers or chills.  No chest pain or shortness of breath  Physical exam is unremarkable she is neurologically intact has no abdominal tenderness.  CBC with mild leukocytosis likely secondary to episodes of emesis.  CMP notable for mild hypokalemia we will replete p.o.  Lipase within normal and without pancreatitis, COVID influenza negative, urinalysis negative for nitrites and leukocytes there is some hemoglobin patient states that she is currently having menstrual bleeding.  Patient given migraine cocktail of Decadron, Benadryl, Toradol, Reglan, 1 L normal saline  On reassessment patient states that her 8/10 headache is now completely gone.  She denies any residual symptoms.  We will discharge patient home with Reglan recommend Tylenol ibuprofen and hydration and follow-up with PCP return precautions given she is agreeable to plan.  Kathryn Mcneil was evaluated in Emergency Department on 02/05/2021 for the symptoms described in the history of present illness. She was evaluated in the context of the global COVID-19 pandemic, which necessitated consideration that the patient might be at risk for infection with the SARS-CoV-2 virus that causes COVID-19. Institutional protocols and  algorithms that pertain to the evaluation of patients at risk for COVID-19 are in a state of rapid change based on information released by  regulatory bodies including the CDC and federal and state organizations. These policies and algorithms were followed during the patient's care in the ED.   Final Clinical Impression(s) / ED Diagnoses Final diagnoses:  Acute nonintractable headache, unspecified headache type    Rx / DC Orders ED Discharge Orders          Ordered    metoCLOPramide (REGLAN) 10 MG tablet  Every 6 hours        02/05/21 1821             Tedd Sias, [place] 02/05/21 1839    Lucrezia Starch, MD 02/09/21 1018

## 2021-02-05 NOTE — ED Triage Notes (Signed)
C/o n/v/d , h/a , body aches x 3 days

## 2021-03-07 ENCOUNTER — Emergency Department (HOSPITAL_BASED_OUTPATIENT_CLINIC_OR_DEPARTMENT_OTHER)
Admission: EM | Admit: 2021-03-07 | Discharge: 2021-03-07 | Disposition: A | Discharge status: Home / Self Care | Payer: BC Managed Care – PPO | Attending: Emergency Medicine | Admitting: Emergency Medicine

## 2021-03-07 ENCOUNTER — Encounter (HOSPITAL_BASED_OUTPATIENT_CLINIC_OR_DEPARTMENT_OTHER): Payer: Self-pay

## 2021-03-07 ENCOUNTER — Other Ambulatory Visit: Payer: Self-pay

## 2021-03-07 DIAGNOSIS — Z5321 Procedure and treatment not carried out due to patient leaving prior to being seen by health care provider: Secondary | ICD-10-CM | POA: Insufficient documentation

## 2021-03-07 DIAGNOSIS — M545 Low back pain, unspecified: Secondary | ICD-10-CM | POA: Insufficient documentation

## 2021-03-07 NOTE — ED Triage Notes (Signed)
Pt arrives ambulatory to ED with c/o back pain states she was having some at work but pain got worse upon waking up this morning. Denies urinary symptoms. Pt reports last BM yesterday. Pt points to left mid/upper back. Denies any other pain. Pt states she took ibuprofen at home with no relief.

## 2021-03-07 NOTE — ED Notes (Signed)
Pt called x 3; not found in either lobby

## 2021-03-08 ENCOUNTER — Encounter (HOSPITAL_BASED_OUTPATIENT_CLINIC_OR_DEPARTMENT_OTHER): Payer: Self-pay | Admitting: *Deleted

## 2021-03-08 ENCOUNTER — Emergency Department (HOSPITAL_BASED_OUTPATIENT_CLINIC_OR_DEPARTMENT_OTHER)
Admission: EM | Admit: 2021-03-08 | Discharge: 2021-03-08 | Disposition: A | Discharge status: Home / Self Care | Payer: BC Managed Care – PPO | Attending: Emergency Medicine | Admitting: Emergency Medicine

## 2021-03-08 DIAGNOSIS — Z79899 Other long term (current) drug therapy: Secondary | ICD-10-CM | POA: Diagnosis not present

## 2021-03-08 DIAGNOSIS — M546 Pain in thoracic spine: Secondary | ICD-10-CM | POA: Insufficient documentation

## 2021-03-08 DIAGNOSIS — F17210 Nicotine dependence, cigarettes, uncomplicated: Secondary | ICD-10-CM | POA: Diagnosis not present

## 2021-03-08 LAB — PREGNANCY, URINE: Preg Test, Ur: NEGATIVE

## 2021-03-08 MED ORDER — OXYCODONE-ACETAMINOPHEN 5-325 MG PO TABS
1.0000 | ORAL_TABLET | Freq: Once | ORAL | Status: AC
  Administered 2021-03-08: 1 via ORAL
  Filled 2021-03-08: qty 1

## 2021-03-08 MED ORDER — METHYLPREDNISOLONE 4 MG PO TBPK: ORAL_TABLET | ORAL | 0 refills | Status: DC

## 2021-03-08 MED ORDER — CYCLOBENZAPRINE HCL 10 MG PO TABS: 10.0000 mg | ORAL_TABLET | Freq: Two times a day (BID) | ORAL | 0 refills | Status: DC | PRN

## 2021-03-08 NOTE — ED Triage Notes (Signed)
Presents with back pain, states at base of left rib. Onset, intermittently on and off for 3-4 days. States she has chronic back pain

## 2021-03-08 NOTE — ED Provider Notes (Signed)
[facility] EMERGENCY DEPARTMENT Provider Note   CSN: [id number] Arrival date & time: 03/08/21  8242     History Chief Complaint  Patient presents with   Back Pain    Kathryn Mcneil is a 40 y.o. female.  Patient is a 40 year old female who presents with back pain.  She says been going on for the last 2 to 3 days.  She describes pain in her upper back area.  It radiates up towards her neck.  Its worse with movements and bending over.  Is also worse when she is laying flat.  She has no associated chest pain.  No shortness of breath.  No pain on inspiration.  She says at times she has a sharp pain that catches in her upper back.  No radiation down her extremities.  No numbness or weakness in her extremities.  She has some chronic low back pain but does not usually have pain in this area.  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      Past Medical History:  Diagnosis Date   Abnormal Pap smear    Anemia    Benign neoplasm of pituitary gland (HCC)    Bipolar 1 disorder, depressed ([place])    biopolar   Depression    Galactorrhea    Migraine    without aura   Nausea    Ulcer     Patient Active Problem List   Diagnosis Date Noted   History of cervical dysplasia 11/08/2012    Past Surgical History:  Procedure Laterality Date   APPENDECTOMY     CARPECTOMY WITH RADIAL STYLOIDECTOMY Right 03/02/2018   Procedure: PROXIMAL ROW CARPECTOMY RIGHT WRIST WITH POSSIBLE RADIAL STYLOIDECTOMY, POSTERIOR INTEROSSEOUS NERVE RESECTION;  Surgeon: Daryll Brod, MD;  Location: [facility];  Service: Orthopedics;  Laterality: Right;  AXILLARY BLOCK   LEEP     leep   TUBAL LIGATION       OB History   No obstetric history on file.     Family History  Problem Relation Age of Onset   Hypertension Mother    Ulcers Mother        gastric   Diabetes Maternal Grandmother    Cancer Maternal Grandfather     Social  History   Tobacco Use   Smoking status: Every Day    Packs/day: 1.00    Types: Cigarettes   Smokeless tobacco: Never   Tobacco comments:    1 pkg a day.  Vaping Use   Vaping Use: Some days  Substance Use Topics   Alcohol use: No   Drug use: No    Home Medications Prior to Admission medications   Medication Sig Start Date End Date Taking? Authorizing Provider  cyclobenzaprine (FLEXERIL) 10 MG tablet Take 1 tablet (10 mg total) by mouth 2 (two) times daily as needed for muscle spasms. 03/08/21   Malvin Johns, MD  doxycycline (VIBRAMYCIN) 100 MG capsule Take 1 capsule (100 mg total) by mouth 2 (two) times daily. 11/19/20   Fransico Meadow, PA-C  ibuprofen (ADVIL,MOTRIN) 600 MG tablet Take 600 mg by mouth every 6 (six) hours as needed.    [provider]  methylPREDNISolone (MEDROL DOSEPAK) 4 MG TBPK tablet Take as directed by mouth, see back of pack for directions 03/08/21   Malvin Johns, MD  metoCLOPramide (REGLAN) 10 MG tablet Take 1 tablet (10 mg total) by mouth every 6 (six) hours. 02/05/21  Pati Gallo S, PA  mupirocin nasal ointment (BACTROBAN) 2 % Apply in each nostril daily 06/05/20   Couture, Cortni S, PA-C  triamcinolone (KENALOG) 0.1 % paste Use as directed 1 application in the mouth or throat 2 (two) times daily. 01/31/20   Avegno, Darrelyn Hillock, FNP    Allergies    Patient has no known allergies.  Review of Systems   Review of Systems  Constitutional:  Negative for chills, diaphoresis, fatigue and fever.  HENT:  Negative for congestion, rhinorrhea and sneezing.   Eyes: Negative.   Respiratory:  Negative for cough, chest tightness and shortness of breath.   Cardiovascular:  Negative for chest pain and leg swelling.  Gastrointestinal:  Negative for abdominal pain, blood in stool, diarrhea, nausea and vomiting.  Genitourinary:  Negative for difficulty urinating, flank pain, frequency and hematuria.  Musculoskeletal:  Positive for back pain. Negative for  arthralgias.  Skin:  Negative for rash.  Neurological:  Negative for dizziness, speech difficulty, weakness, numbness and headaches.   Physical Exam Updated Vital Signs BP 93/66 (BP Location: Right Arm)   Pulse 88   Temp 97.8 F (36.6 C) (Oral)   Resp 18   Ht 5\' 4"  (1.626 m)   Wt 58.1 kg   SpO2 100%   BMI 21.99 kg/m   Physical Exam Constitutional:      Appearance: She is well-developed.  HENT:     Head: Normocephalic and atraumatic.  Eyes:     Pupils: Pupils are equal, round, and reactive to light.  Cardiovascular:     Rate and Rhythm: Normal rate and regular rhythm.     Heart sounds: Normal heart sounds.  Pulmonary:     Effort: Pulmonary effort is normal. No respiratory distress.     Breath sounds: Normal breath sounds. No wheezing or rales.  Chest:     Chest wall: No tenderness.  Abdominal:     General: Bowel sounds are normal.     Palpations: Abdomen is soft.     Tenderness: There is no abdominal tenderness. There is no guarding or rebound.  Musculoskeletal:        General: Normal range of motion.     Cervical back: Normal range of motion and neck supple.     Comments: Positive tenderness in her left upper back around her shoulder blade.  Also goes up into her neck.  Is reproducible on palpation.  No external lesions noted.  No specific spinal tenderness noted.  No leg swelling or calf tenderness noted.  Motor 5 out of 5 all extremities, sensation grossly intact to light touch all extremities  Lymphadenopathy:     Cervical: No cervical adenopathy.  Skin:    General: Skin is warm and dry.     Findings: No rash.  Neurological:     Mental Status: She is alert and oriented to person, place, and time.    ED Results / Procedures / Treatments   Labs (all labs ordered are listed, but only abnormal results are displayed) Labs Reviewed  PREGNANCY, URINE    EKG None  Radiology No results found.  Procedures Procedures   Medications Ordered in ED Medications   oxyCODONE-acetaminophen (PERCOCET/ROXICET) 5-325 MG per tablet 1 tablet (1 tablet Oral Given 03/08/21 7846)    ED Course  I have reviewed the triage vital signs and the nursing notes.  Pertinent labs & imaging results that were available during my care of the patient were reviewed by me and considered in my medical decision making (  see chart for details).    MDM Rules/Calculators/A&P                           Patient presents with pain to her upper back.  It seems to be musculoskeletal in nature.  She does not have any neurologic deficits or radicular symptoms.  There is no other symptoms that sound concerning for ACS or PE.  No symptoms that would be more concerning for dissection.  She was discharged home in good condition.  She was given a prescription for Flexeril and a Medrol Dosepak.  She was given a referral to follow-up with sports medicine if her symptoms are not improving.  Return precautions were given. Final Clinical Impression(s) / ED Diagnoses Final diagnoses:  Acute left-sided thoracic back pain    Rx / DC Orders ED Discharge Orders          Ordered    cyclobenzaprine (FLEXERIL) 10 MG tablet  2 times daily PRN        03/08/21 0842    methylPREDNISolone (MEDROL DOSEPAK) 4 MG TBPK tablet        03/08/21 0037             Malvin Johns, MD 03/08/21 [phone]

## 2021-04-01 ENCOUNTER — Emergency Department (HOSPITAL_BASED_OUTPATIENT_CLINIC_OR_DEPARTMENT_OTHER)
Admission: EM | Admit: 2021-04-01 | Discharge: 2021-04-01 | Disposition: A | Discharge status: Home / Self Care | Payer: BC Managed Care – PPO | Attending: Emergency Medicine | Admitting: Emergency Medicine

## 2021-04-01 ENCOUNTER — Other Ambulatory Visit: Payer: Self-pay

## 2021-04-01 ENCOUNTER — Encounter (HOSPITAL_BASED_OUTPATIENT_CLINIC_OR_DEPARTMENT_OTHER): Payer: Self-pay | Admitting: *Deleted

## 2021-04-01 DIAGNOSIS — K029 Dental caries, unspecified: Secondary | ICD-10-CM | POA: Insufficient documentation

## 2021-04-01 DIAGNOSIS — Z859 Personal history of malignant neoplasm, unspecified: Secondary | ICD-10-CM | POA: Insufficient documentation

## 2021-04-01 DIAGNOSIS — F17210 Nicotine dependence, cigarettes, uncomplicated: Secondary | ICD-10-CM | POA: Diagnosis not present

## 2021-04-01 DIAGNOSIS — K0889 Other specified disorders of teeth and supporting structures: Secondary | ICD-10-CM | POA: Diagnosis present

## 2021-04-01 MED ORDER — ACETAMINOPHEN 325 MG PO TABS
650.0000 mg | ORAL_TABLET | Freq: Once | ORAL | Status: AC
  Administered 2021-04-01: 650 mg via ORAL
  Filled 2021-04-01: qty 2

## 2021-04-01 MED ORDER — OXYCODONE-ACETAMINOPHEN 5-325 MG PO TABS
1.0000 | ORAL_TABLET | Freq: Once | ORAL | Status: AC
Start: 2021-04-01 — End: 2021-04-01
  Administered 2021-04-01: 1 via ORAL
  Filled 2021-04-01: qty 1

## 2021-04-01 MED ORDER — PENICILLIN V POTASSIUM 500 MG PO TABS: 500.0000 mg | ORAL_TABLET | Freq: Four times a day (QID) | ORAL | 0 refills | Status: AC

## 2021-04-01 NOTE — ED Triage Notes (Signed)
Dental pain x 3 days.  

## 2021-04-01 NOTE — Discharge Instructions (Signed)
Please take antibiotics as prescribed  Please stop taking Goody powders  Please take Tylenol and ibuprofen as discussed below.  Please use Orajel.  Please follow-up with your dentist on Monday at your appointment.  Please use Tylenol or ibuprofen for pain.  You may use 600 mg ibuprofen every 6 hours or 1000 mg of Tylenol every 6 hours.  You may choose to alternate between the 2.  This would be most effective.  Not to exceed 4 g of Tylenol within 24 hours.  Not to exceed 3200 mg ibuprofen 24 hours.

## 2021-04-01 NOTE — ED Notes (Signed)
Dental pain x 3 days.  Dc papers given.  Pt verbalizes understanding, visably upset and cursing that no pain meds were prescribed

## 2021-04-01 NOTE — ED Provider Notes (Signed)
[facility] EMERGENCY DEPARTMENT Provider Note   CSN: [id number] Arrival date & time: 04/01/21  1626     History   Chief Complaint Chief Complaint  Patient presents with   Dental Pain    HPI Kathryn Mcneil is a 40 y.o. female.  Patient presents to the emergency department with a dental complaint. Symptoms began 3 days ago. The patient has tried to alleviate pain with goody's powder ibuprofen and aleve.  Pain rated as severe, characterized as throbbing in nature and located left upper dentition. Patient denies fever, night sweats, chills, difficulty swallowing or opening mouth, SOB, nuchal rigidity or decreased ROM of neck.  Patient does not have a dentist and requests a resource guide at discharge.      HPI  Past Medical History:  Diagnosis Date   Abnormal Pap smear    Anemia    Benign neoplasm of pituitary gland (HCC)    Bipolar 1 disorder, depressed ([place])    biopolar   Depression    Galactorrhea    Migraine    without aura   Nausea    Ulcer     Patient Active Problem List   Diagnosis Date Noted   History of cervical dysplasia 11/08/2012    Past Surgical History:  Procedure Laterality Date   APPENDECTOMY     CARPECTOMY WITH RADIAL STYLOIDECTOMY Right 03/02/2018   Procedure: PROXIMAL ROW CARPECTOMY RIGHT WRIST WITH POSSIBLE RADIAL STYLOIDECTOMY, POSTERIOR INTEROSSEOUS NERVE RESECTION;  Surgeon: Daryll Brod, MD;  Location: [facility];  Service: Orthopedics;  Laterality: Right;  AXILLARY BLOCK   LEEP     leep   TUBAL LIGATION       OB History   No obstetric history on file.      Home Medications    Prior to Admission medications   Medication Sig Start Date End Date Taking? Authorizing Provider  penicillin v potassium (VEETID) 500 MG tablet Take 1 tablet (500 mg total) by mouth 4 (four) times daily for 14 days. 04/01/21 04/15/21 Yes Adarian Bur S, PA  cyclobenzaprine (FLEXERIL) 10 MG tablet Take 1 tablet (10 mg total) by  mouth 2 (two) times daily as needed for muscle spasms. 03/08/21   Malvin Johns, MD  ibuprofen (ADVIL,MOTRIN) 600 MG tablet Take 600 mg by mouth every 6 (six) hours as needed.    [provider]  methylPREDNISolone (MEDROL DOSEPAK) 4 MG TBPK tablet Take as directed by mouth, see back of pack for directions 03/08/21   Malvin Johns, MD  metoCLOPramide (REGLAN) 10 MG tablet Take 1 tablet (10 mg total) by mouth every 6 (six) hours. 02/05/21   Tedd Sias, PA  mupirocin nasal ointment (BACTROBAN) 2 % Apply in each nostril daily 06/05/20   Couture, Cortni S, PA-C  triamcinolone (KENALOG) 0.1 % paste Use as directed 1 application in the mouth or throat 2 (two) times daily. 01/31/20   Avegno, Darrelyn Hillock, FNP    Family History Family History  Problem Relation Age of Onset   Hypertension Mother    Ulcers Mother        gastric   Diabetes Maternal Grandmother    Cancer Maternal Grandfather     Social History Social History   Tobacco Use   Smoking status: Every Day    Packs/day: 1.00    Types: Cigarettes   Smokeless tobacco: Never   Tobacco comments:    1 pkg a day.  Vaping Use   Vaping Use: Some days  Substance Use Topics  Alcohol use: No   Drug use: No     Allergies   Patient has no known allergies.   Review of Systems Denies fevers, chills, difficulty swallowing or eating, changes in voice, pain under tongue, nausea, vomiting, lightheadedness or dizziness. No trismus   Physical Exam Updated Vital Signs BP 107/74 (BP Location: Right Arm)   Pulse 81   Temp 97.9 F (36.6 C) (Oral)   Resp 16   Ht 5\' 4"  (1.626 m)   Wt 59 kg   LMP 03/25/2021   SpO2 100%   BMI 22.31 kg/m   Physical Exam Physical Exam  Constitutional: Pt appears well-developed and well-nourished.  HENT:  Head: Normocephalic.  Right Ear: Tympanic membrane, external ear and ear canal normal.  Left Ear: Tympanic membrane, external ear and ear canal normal.  Nose: Nose normal. Right sinus  exhibits no maxillary sinus tenderness and no frontal sinus tenderness. Left sinus exhibits no maxillary sinus tenderness and no frontal sinus tenderness.  Mouth/Throat: Uvula is midline, oropharynx is clear and moist and mucous membranes are normal. No oral lesions. No uvula swelling or lacerations. No oropharyngeal exudate, posterior oropharyngeal edema, posterior oropharyngeal erythema or tonsillar abscesses.  Poor dentition No gingival swelling, fluctuance or induration No gross abscess  No sublingual edema, tenderness to palpation, or sign of Ludwig's angina, or deep space infection Pain at teeth #12 and 11 Left upper gumline is receding.  There is no fluctuance.  No sublingual tenderness. Eyes: Conjunctivae are normal. Pupils are equal, round, and reactive to light. Right eye exhibits no discharge. Left eye exhibits no discharge.  Neck: Normal range of motion. Neck supple.  No stridor Handling secretions without difficulty No nuchal rigidity No cervical lymphadenopathy Cardiovascular: Normal rate, regular rhythm and normal heart sounds.   Pulmonary/Chest: Effort normal. No respiratory distress.  Equal chest rise  Abdominal: Soft. Bowel sounds are normal. Pt exhibits no distension. There is no tenderness.  Lymphadenopathy: Pt has no cervical adenopathy.  Neurological: Pt is alert and oriented x 4  Skin: Skin is warm and dry.  Psychiatric: Pt has a normal mood and affect.  Nursing note and vitals reviewed.   ED Treatments / Results  Labs (all labs ordered are listed, but only abnormal results are displayed) Labs Reviewed - No data to display  EKG    Radiology No results found.  Procedures Procedures (including critical care time)  Medications Ordered in ED Medications  oxyCODONE-acetaminophen (PERCOCET/ROXICET) 5-325 MG per tablet 1 tablet (1 tablet Oral Given 04/01/21 1753)  acetaminophen (TYLENOL) tablet 650 mg (650 mg Oral Given 04/01/21 1753)     Initial  Impression / Assessment and Plan / ED Course  I have reviewed the triage vital signs and the nursing notes.  Pertinent labs & imaging results that were available during my care of the patient were reviewed by me and considered in my medical decision making (see chart for details).        Patient with dentalgia.  No abscess requiring immediate incision and drainage.  Exam not concerning for Ludwig's angina or pharyngeal abscess.  Will treat with penicillin and Tylenol and ibuprofen Orajel. Pt instructed to follow-up with dentist.  Discussed return precautions. Pt safe for discharge.  Patient informed me that she has follow-up appointment on Monday with her dentist.  Final Clinical Impressions(s) / ED Diagnoses   Final diagnoses:  Pain due to dental caries    ED Discharge Orders          Ordered  penicillin v potassium (VEETID) 500 MG tablet  4 times daily        04/01/21 1746              Tedd Sias, [place] 04/01/21 1756    Lucrezia Starch, MD 04/01/21 2130

## 2021-06-16 ENCOUNTER — Other Ambulatory Visit: Payer: Self-pay

## 2021-06-16 ENCOUNTER — Encounter (HOSPITAL_COMMUNITY): Payer: Self-pay

## 2021-06-16 ENCOUNTER — Emergency Department (HOSPITAL_COMMUNITY)
Admission: EM | Admit: 2021-06-16 | Discharge: 2021-06-16 | Disposition: A | Discharge status: Home / Self Care | Payer: BC Managed Care – PPO | Attending: Emergency Medicine | Admitting: Emergency Medicine

## 2021-06-16 DIAGNOSIS — A599 Trichomoniasis, unspecified: Secondary | ICD-10-CM | POA: Diagnosis not present

## 2021-06-16 DIAGNOSIS — Z85858 Personal history of malignant neoplasm of other endocrine glands: Secondary | ICD-10-CM | POA: Diagnosis not present

## 2021-06-16 DIAGNOSIS — L292 Pruritus vulvae: Secondary | ICD-10-CM | POA: Diagnosis present

## 2021-06-16 LAB — URINALYSIS, ROUTINE W REFLEX MICROSCOPIC
Bilirubin Urine: NEGATIVE
Glucose, UA: NEGATIVE mg/dL
Ketones, ur: 5 mg/dL — AB
Nitrite: NEGATIVE
Protein, ur: 30 mg/dL — AB
RBC / HPF: >50 RBC/hpf — ABNORMAL HIGH (ref 0–5)
Specific Gravity, Urine: 1.024 (ref 1.005–1.030)
WBC, UA: >50 WBC/hpf — ABNORMAL HIGH (ref 0–5)
pH: 5 (ref 5.0–8.0)

## 2021-06-16 LAB — PREGNANCY, URINE: Preg Test, Ur: NEGATIVE

## 2021-06-16 LAB — WET PREP, GENITAL
Clue Cells Wet Prep HPF POC: NONE SEEN
Sperm: NONE SEEN
WBC, Wet Prep HPF POC: >=10 — AB (ref <10)
Yeast Wet Prep HPF POC: NONE SEEN

## 2021-06-16 MED ORDER — LIDOCAINE HCL 2 % IJ SOLN
INTRAMUSCULAR | Status: AC
  Administered 2021-06-16: 2.1 mL
  Filled 2021-06-16: qty 20

## 2021-06-16 MED ORDER — DOXYCYCLINE HYCLATE 100 MG PO CAPS: 100.0000 mg | ORAL_CAPSULE | Freq: Two times a day (BID) | ORAL | 0 refills | Status: DC

## 2021-06-16 MED ORDER — METRONIDAZOLE 500 MG PO TABS
2000.0000 mg | ORAL_TABLET | Freq: Once | ORAL | Status: AC
  Administered 2021-06-16: 2000 mg via ORAL
  Filled 2021-06-16: qty 4

## 2021-06-16 MED ORDER — CEFTRIAXONE SODIUM 1 G IJ SOLR
500.0000 mg | Freq: Once | INTRAMUSCULAR | Status: AC
  Administered 2021-06-16: 500 mg via INTRAMUSCULAR
  Filled 2021-06-16: qty 10

## 2021-06-16 NOTE — ED Provider Notes (Signed)
[facility] DEPT Provider Note   CSN: [id number] Arrival date & time: 06/16/21  1653     History  Chief Complaint  Patient presents with   Vaginal Itching    Kathryn Mcneil is a 41 y.o. female.  Patient is a 41 year old female with a history of depression, bipolar disease, anemia, benign neoplasm of the pituitary gland with galactorrhea who is presenting today with complaints of vaginal burning, labial swelling and itching with a minimal amount of white discharge.  She reports that symptoms started yesterday and have gradually worsened today.  She also reports having a new sexual partner this month and last had intercourse 3-1/2 weeks ago which was unprotected.  Her last menstrual period she reports was around Christmas time and she should be having her menses any day now.  She does not use any birth control.  She reports she did have chlamydia once a long time ago and was treated for that and has not had any other STIs.  She denies any abdominal pain or urinary symptoms.  She has not started using any new soaps or vaginal products.  The history is provided by the patient.  Vaginal Itching      Home Medications Prior to Admission medications   Medication Sig Start Date End Date Taking? Authorizing Provider  doxycycline (VIBRAMYCIN) 100 MG capsule Take 1 capsule (100 mg total) by mouth 2 (two) times daily. 06/16/21  Yes Blanchie Dessert, MD  cyclobenzaprine (FLEXERIL) 10 MG tablet Take 1 tablet (10 mg total) by mouth 2 (two) times daily as needed for muscle spasms. 03/08/21   Malvin Johns, MD  ibuprofen (ADVIL,MOTRIN) 600 MG tablet Take 600 mg by mouth every 6 (six) hours as needed.    [provider]  methylPREDNISolone (MEDROL DOSEPAK) 4 MG TBPK tablet Take as directed by mouth, see back of pack for directions 03/08/21   Malvin Johns, MD  metoCLOPramide (REGLAN) 10 MG tablet Take 1 tablet (10 mg total) by mouth every 6 (six) hours. 02/05/21    Tedd Sias, PA  mupirocin nasal ointment (BACTROBAN) 2 % Apply in each nostril daily 06/05/20   Couture, Cortni S, PA-C  triamcinolone (KENALOG) 0.1 % paste Use as directed 1 application in the mouth or throat 2 (two) times daily. 01/31/20   Avegno, Darrelyn Hillock, FNP      Allergies    Patient has no known allergies.    Review of Systems   Review of Systems  Physical Exam Updated Vital Signs BP 103/75 (BP Location: Left Arm)    Pulse (!) 101    Temp 98.3 F (36.8 C) (Oral)    Resp 20    Ht 5\' 4"  (1.626 m)    Wt 59.9 kg    LMP 05/10/2021    SpO2 100%    BMI 22.66 kg/m  Physical Exam Vitals and nursing note reviewed.  Constitutional:      Appearance: Normal appearance.  HENT:     Head: Normocephalic.  Cardiovascular:     Rate and Rhythm: Normal rate.  Pulmonary:     Effort: Pulmonary effort is normal.  Abdominal:     General: Abdomen is flat.     Palpations: Abdomen is soft.     Tenderness: There is no abdominal tenderness. There is no guarding or rebound.     Hernia: No hernia is present.  Genitourinary:    Comments: No cervical motion tenderness or adnexal tenderness but thick yellow discharge present.  Externally she has mild  swelling of the labia minora and on the right labia minora she has areas of macerated tissue but no distinct lesions noted.   Musculoskeletal:     Right lower leg: No edema.     Left lower leg: No edema.  Skin:    General: Skin is warm and dry.  Neurological:     Mental Status: She is alert and oriented to person, place, and time. Mental status is at baseline.  Psychiatric:        Mood and Affect: Mood normal.        Behavior: Behavior normal.    ED Results / Procedures / Treatments   Labs (all labs ordered are listed, but only abnormal results are displayed) Labs Reviewed  WET PREP, GENITAL - Abnormal; Notable for the following components:      Result Value   Trich, Wet Prep PRESENT (*)    WBC, Wet Prep HPF POC >=10 (*)    All other  components within normal limits  URINALYSIS, ROUTINE W REFLEX MICROSCOPIC - Abnormal; Notable for the following components:   APPearance CLOUDY (*)    Hgb urine dipstick SMALL (*)    Ketones, ur 5 (*)    Protein, ur 30 (*)    Leukocytes,Ua LARGE (*)    RBC / HPF >50 (*)    WBC, UA >50 (*)    Bacteria, UA FEW (*)    All other components within normal limits  PREGNANCY, URINE  GC/CHLAMYDIA PROBE AMP ([place]) NOT AT [facility]    EKG None  Radiology No results found.  Procedures Procedures    Medications Ordered in ED Medications  metroNIDAZOLE (FLAGYL) tablet 2,000 mg (has no administration in time range)  cefTRIAXone (ROCEPHIN) injection 500 mg (500 mg Intramuscular Given 06/16/21 1836)  lidocaine (XYLOCAINE) 2 % (with pres) injection (2.1 mLs  Given 06/16/21 1837)    ED Course/ Medical Decision Making/ A&P                           Medical Decision Making  41 year old female presenting today with labial swelling, vaginal itching and burning in lieu of a new sexual partner within the last month.  Patient does not have any cervical motion tenderness concerning for PID or TOA.  She has no adnexal tenderness.  She does have a thick yellow discharge and concern for STI.  Externally she does have swelling of her labia minora and on the right side some areas of macerated tissue but no distinct herpes type lesions.  Almost appears to be traumatic she did report wearing tight and tight fitting clothing yesterday.  We will give her an antifungal cream just in case.  She was treated for STI with Rocephin and doxycycline.  Cultures are pending. I independently interpreted patient's labs and her urine today does show evidence of infection however feel that it is all related to her vaginitis.  She is positive for trichomonas.  She was treated with 2 g of Flagyl here as well as the above antibiotics.  Findings were discussed with the patient and her questions were answered.        Final  Clinical Impression(s) / ED Diagnoses Final diagnoses:  Trichomonas infection    Rx / DC Orders ED Discharge Orders          Ordered    doxycycline (VIBRAMYCIN) 100 MG capsule  2 times daily        06/16/21 1931  Blanchie Dessert, MD 06/16/21 1932

## 2021-06-16 NOTE — Discharge Instructions (Addendum)
You were treated for all sexually transmitted infections today.  You did test positive for trichomonas.  Your partner needs to be treated.  You were also given a prescription for an antibiotic that was sent to your pharmacy.

## 2021-06-16 NOTE — ED Triage Notes (Signed)
"  Last sexual intercourse was with a new partner 3.5 weeks ago, yesterday my vagina started itching, burning and is swollen. Some white discharge, but it doesn't feel like a yeast infection I have had before" per pt

## 2021-06-16 NOTE — ED Provider Triage Note (Signed)
Emergency Medicine Provider Triage Evaluation Note  Kathryn Mcneil , a 41 y.o. female  was evaluated in triage.  Pt complains of vaginal burning, itching, and white discharge over the last week or so.  Patient had new sexual partner 3 and half weeks ago.  She did notice an isolated lesion.  Also having dysuria.  No fever, chills, lower abdominal pain.  Review of Systems  Positive:  Negative: See above   Physical Exam  BP 103/75 (BP Location: Left Arm)    Pulse (!) 101    Temp 98.3 F (36.8 C) (Oral)    Resp 20    Ht 5\' 4"  (1.626 m)    Wt 59.9 kg    LMP 05/10/2021    SpO2 100%    BMI 22.66 kg/m  Gen:   Awake, no distress   Resp:  Normal effort  MSK:   Moves extremities without difficulty  Other:    Medical Decision Making  Medically screening exam initiated at 5:18 PM.  Appropriate orders placed.  [name] was informed that the remainder of the evaluation will be completed by another provider, this initial triage assessment does not replace that evaluation, and the importance of remaining in the ED until their evaluation is complete.   Will likely need pelvic exam.   Hendricks Limes, PA-C 06/16/21 1721

## 2021-07-20 ENCOUNTER — Other Ambulatory Visit: Payer: Self-pay

## 2021-07-20 ENCOUNTER — Encounter (HOSPITAL_BASED_OUTPATIENT_CLINIC_OR_DEPARTMENT_OTHER): Payer: Self-pay | Admitting: *Deleted

## 2021-07-20 ENCOUNTER — Emergency Department (HOSPITAL_BASED_OUTPATIENT_CLINIC_OR_DEPARTMENT_OTHER)
Admission: EM | Admit: 2021-07-20 | Discharge: 2021-07-20 | Discharge status: Against Medical Advice | Payer: Medicare Other | Attending: Emergency Medicine | Admitting: Emergency Medicine

## 2021-07-20 DIAGNOSIS — Z202 Contact with and (suspected) exposure to infections with a predominantly sexual mode of transmission: Secondary | ICD-10-CM | POA: Insufficient documentation

## 2021-07-20 DIAGNOSIS — N898 Other specified noninflammatory disorders of vagina: Secondary | ICD-10-CM | POA: Insufficient documentation

## 2021-07-20 LAB — URINALYSIS, ROUTINE W REFLEX MICROSCOPIC
Bilirubin Urine: NEGATIVE
Glucose, UA: NEGATIVE mg/dL
Ketones, ur: NEGATIVE mg/dL
Nitrite: NEGATIVE
Protein, ur: NEGATIVE mg/dL
Specific Gravity, Urine: 1.01 (ref 1.005–1.030)
pH: 5.5 (ref 5.0–8.0)

## 2021-07-20 LAB — URINALYSIS, MICROSCOPIC (REFLEX)

## 2021-07-20 LAB — PREGNANCY, URINE: Preg Test, Ur: NEGATIVE

## 2021-07-20 NOTE — ED Triage Notes (Signed)
Cough x 4 days. She feels sob. O2 sats 97%. She feels like she has bronchitis. STD exposure. Vaginal discharge.  ?

## 2021-07-26 ENCOUNTER — Encounter (HOSPITAL_BASED_OUTPATIENT_CLINIC_OR_DEPARTMENT_OTHER): Payer: Self-pay | Admitting: *Deleted

## 2021-07-26 ENCOUNTER — Emergency Department (HOSPITAL_BASED_OUTPATIENT_CLINIC_OR_DEPARTMENT_OTHER)
Admission: EM | Admit: 2021-07-26 | Discharge: 2021-07-26 | Disposition: A | Discharge status: Home / Self Care | Payer: Medicare Other | Attending: Emergency Medicine | Admitting: Emergency Medicine

## 2021-07-26 ENCOUNTER — Emergency Department (HOSPITAL_BASED_OUTPATIENT_CLINIC_OR_DEPARTMENT_OTHER): Payer: Medicare Other

## 2021-07-26 ENCOUNTER — Other Ambulatory Visit: Payer: Self-pay

## 2021-07-26 DIAGNOSIS — Z202 Contact with and (suspected) exposure to infections with a predominantly sexual mode of transmission: Secondary | ICD-10-CM | POA: Insufficient documentation

## 2021-07-26 DIAGNOSIS — N898 Other specified noninflammatory disorders of vagina: Secondary | ICD-10-CM | POA: Diagnosis not present

## 2021-07-26 DIAGNOSIS — F17210 Nicotine dependence, cigarettes, uncomplicated: Secondary | ICD-10-CM | POA: Diagnosis not present

## 2021-07-26 DIAGNOSIS — J209 Acute bronchitis, unspecified: Secondary | ICD-10-CM | POA: Insufficient documentation

## 2021-07-26 DIAGNOSIS — R059 Cough, unspecified: Secondary | ICD-10-CM | POA: Diagnosis present

## 2021-07-26 MED ORDER — CEFTRIAXONE SODIUM 1 G IJ SOLR
1.0000 g | Freq: Once | INTRAMUSCULAR | Status: AC
  Administered 2021-07-26: 1 g via INTRAMUSCULAR
  Filled 2021-07-26: qty 10

## 2021-07-26 MED ORDER — LIDOCAINE HCL (PF) 1 % IJ SOLN
INTRAMUSCULAR | Status: AC
  Administered 2021-07-26: 5 mL
  Filled 2021-07-26: qty 5

## 2021-07-26 MED ORDER — BENZONATATE 100 MG PO CAPS: 100.0000 mg | ORAL_CAPSULE | Freq: Three times a day (TID) | ORAL | 0 refills | Status: DC

## 2021-07-26 MED ORDER — METRONIDAZOLE 500 MG PO TABS
500.0000 mg | ORAL_TABLET | Freq: Once | ORAL | Status: AC
  Administered 2021-07-26: 500 mg via ORAL
  Filled 2021-07-26: qty 1

## 2021-07-26 MED ORDER — METRONIDAZOLE 500 MG PO TABS: 500.0000 mg | ORAL_TABLET | Freq: Two times a day (BID) | ORAL | 0 refills | Status: DC

## 2021-07-26 NOTE — Discharge Instructions (Signed)
Please return to the ED with any new worsening signs or symptoms such as pelvic pain, fevers ?Please pick up antibiotic medication sent in for you.  Please be aware the fact that you cannot drink alcohol and metronidazole. ?Please pick up Tessalon Perles early convenience. ?Please follow-up with your PCP for any ongoing needs ?

## 2021-07-26 NOTE — ED Provider Notes (Signed)
?[facility] EMERGENCY DEPARTMENT ?Provider Note ? ? ?CSN: [id number] ?Arrival date & time: 07/26/21  1926 ? ?  ? ?History ? ?Chief Complaint  ?Patient presents with  ? Exposure to STD  ? Cough  ? ? ?Kathryn Mcneil is a 41 y.o. female with history of migraine, depression, bipolar 1, anemia.  Patient presents to ED due to vaginal discharge and cough.  Patient states that 5 to 6 days ago she began experiencing vaginal discharge.  Patient states vaginal discharge is yellowish in color with no odor.  Patient reports that beginning of last week she was told by recent sexual partner that he had tested positive for trichomonas and she presented to this ED for evaluation.  Patient reports last sexual encounter with this individual was 2 and half weeks ago.  Patient states that she left without being seen due to wait time.  Patient is also endorsing a cough that began 2 weeks ago.  Patient denies any preceding URI.  The patient states that she "gets bronchitis a lot".  The patient reports that the cough is a barking dry cough.  The patient reports she does smoke 1 pack/day and has done so for 20 years.  Patient denies any nausea, vomiting, diarrhea, fevers, pelvic pain, dysuria, shortness of breath, chest pain. ? ? ? ?Exposure to STD ?Pertinent negatives include no chest pain and no shortness of breath.  ?Cough ?Associated symptoms: no chest pain, no chills, no fever and no shortness of breath   ? ?  ? ?Home Medications ?Prior to Admission medications   ?Medication Sig Start Date End Date Taking? Authorizing Provider  ?benzonatate (TESSALON) 100 MG capsule Take 1 capsule (100 mg total) by mouth every 8 (eight) hours. 07/26/21  Yes Azucena Cecil, PA-C  ?metroNIDAZOLE (FLAGYL) 500 MG tablet Take 1 tablet (500 mg total) by mouth 2 (two) times daily. 07/26/21  Yes Azucena Cecil, PA-C  ?cyclobenzaprine (FLEXERIL) 10 MG tablet Take 1 tablet (10 mg total) by mouth 2 (two) times daily as needed for muscle  spasms. 03/08/21   Malvin Johns, MD  ?doxycycline (VIBRAMYCIN) 100 MG capsule Take 1 capsule (100 mg total) by mouth 2 (two) times daily. 06/16/21   Blanchie Dessert, MD  ?ibuprofen (ADVIL,MOTRIN) 600 MG tablet Take 600 mg by mouth every 6 (six) hours as needed.    [provider]  ?methylPREDNISolone (MEDROL DOSEPAK) 4 MG TBPK tablet Take as directed by mouth, see back of pack for directions 03/08/21   Malvin Johns, MD  ?metoCLOPramide (REGLAN) 10 MG tablet Take 1 tablet (10 mg total) by mouth every 6 (six) hours. 02/05/21   Tedd Sias, PA  ?mupirocin nasal ointment (BACTROBAN) 2 % Apply in each nostril daily 06/05/20   Couture, Cortni S, PA-C  ?triamcinolone (KENALOG) 0.1 % paste Use as directed 1 application in the mouth or throat 2 (two) times daily. 01/31/20   Emerson Monte, FNP  ?   ? ?Allergies    ?Patient has no known allergies.   ? ?Review of Systems   ?Review of Systems  ?Constitutional:  Negative for chills and fever.  ?Respiratory:  Positive for cough. Negative for shortness of breath.   ?Cardiovascular:  Negative for chest pain.  ?Gastrointestinal:  Negative for diarrhea, nausea and vomiting.  ?Genitourinary:  Positive for vaginal discharge. Negative for dysuria and pelvic pain.  ?All other systems reviewed and are negative. ? ?Physical Exam ?Updated Vital Signs ?BP 115/84 (BP Location: Left Arm)   Pulse Marland Kitchen)  122   Temp 98.2 ?F (36.8 ?C) (Oral)   Resp 20 Comment: filing RN made typo  Ht '5\' 4"'$  (1.626 m)   Wt 59 kg   LMP 07/17/2021   SpO2 99%   BMI 22.31 kg/m?  ?Physical Exam ?Vitals and nursing note reviewed.  ?Constitutional:   ?   General: She is not in acute distress. ?   Appearance: Normal appearance. She is not ill-appearing, toxic-appearing or diaphoretic.  ?HENT:  ?   Head: Normocephalic and atraumatic.  ?   Nose: Nose normal. No congestion.  ?   Mouth/Throat:  ?   Mouth: Mucous membranes are moist.  ?   Pharynx: Oropharynx is clear.  ?Eyes:  ?   Extraocular Movements:  Extraocular movements intact.  ?   Conjunctiva/sclera: Conjunctivae normal.  ?   Pupils: Pupils are equal, round, and reactive to light.  ?Cardiovascular:  ?   Rate and Rhythm: Normal rate and regular rhythm.  ?Pulmonary:  ?   Effort: Pulmonary effort is normal. No respiratory distress.  ?   Breath sounds: Normal breath sounds. No stridor. No wheezing, rhonchi or rales.  ?Abdominal:  ?   General: Abdomen is flat. Bowel sounds are normal.  ?   Palpations: Abdomen is soft.  ?   Tenderness: There is no abdominal tenderness.  ?Genitourinary: ?   Comments: Patient deferred pelvic exam. ?Musculoskeletal:  ?   Cervical back: Normal range of motion and neck supple. No tenderness.  ?Skin: ?   General: Skin is warm and dry.  ?   Capillary Refill: Capillary refill takes less than 2 seconds.  ?Neurological:  ?   Mental Status: She is alert and oriented to person, place, and time.  ? ? ?ED Results / Procedures / Treatments   ?Labs ?(all labs ordered are listed, but only abnormal results are displayed) ?Labs Reviewed - No data to display ? ?EKG ?None ? ?Radiology ?DG Chest 2 View ? ?Result Date: 07/26/2021 ?CLINICAL DATA:  Cough for 8 days. EXAM: CHEST - 2 VIEW COMPARISON:  09/19/2005 FINDINGS: Mild hyperinflation. Normal heart size and pulmonary vascularity. No focal airspace disease or consolidation in the lungs. No blunting of costophrenic angles. No pneumothorax. Mediastinal contours appear intact. Old right rib fracture. IMPRESSION: No active cardiopulmonary disease. Electronically Signed   By: Lucienne Capers M.D.   On: 07/26/2021 20:29   ? ?Procedures ?Procedures  ? ? ?Medications Ordered in ED ?Medications  ?cefTRIAXone (ROCEPHIN) injection 1 g (has no administration in time range)  ?metroNIDAZOLE (FLAGYL) tablet 500 mg (has no administration in time range)  ? ? ?ED Course/ Medical Decision Making/ A&P ?  ?                        ?Medical Decision Making ?Amount and/or Complexity of Data Reviewed ?Radiology:  ordered. ? ?Risk ?Prescription drug management. ? ? ?41 year old female presents ED for evaluation of vaginal discharge and cough.  Please see HPI further details. ? ?On examination, the patient is afebrile, not hypoxic, clear lung sounds bilaterally.  The patient's has soft compressible abdomen in all 4 quadrants.  Patient is endorsing vaginal discharge.  I discussed with the patient the need to do a pelvic exam in order to properly diagnose her however she declined at this time.  The patient just wishes to be treated for suspected trichomonas infection as well as possible STI exposure. ? ?Patient chest x-ray does not show any signs of consolidation, effusion. ? ?Patient treated  utilizing 1 g Rocephin shot IM.  The patient was also given her initial dose of metronidazole here tonight due to the fact that pharmacies are closed at this hour. ? ?Patient was provided with return precautions and she voiced understanding.  Patient had all her questions answered to her satisfaction.  The patient is stable for discharge at this time. ? ? ?Final Clinical Impression(s) / ED Diagnoses ?Final diagnoses:  ?Possible exposure to STD  ?Acute bronchitis, unspecified organism  ? ? ?Rx / DC Orders ?ED Discharge Orders   ? ?      Ordered  ?  metroNIDAZOLE (FLAGYL) 500 MG tablet  2 times daily       ? 07/26/21 2050  ?  benzonatate (TESSALON) 100 MG capsule  Every 8 hours       ? 07/26/21 2055  ? ?  ?  ? ?  ? ? ?  ?Azucena Cecil, PA-C ?07/26/21 2058 ? ?  ?Blanchie Dessert, MD ?07/26/21 2158 ? ?

## 2021-07-26 NOTE — ED Notes (Signed)
Pt. Reports she has a cough x 8 days now not getting any better.  Pt. Reports non productive.   ? ?Pt. Reports she has an STD .Marland KitchenMarland Kitchen Trich.  Pt. Reports her boyfriend told her last week.   She would like to be treated for this. ?

## 2021-07-26 NOTE — ED Triage Notes (Signed)
Cough for several weeks. Also reports exposure to trich ?

## 2022-01-25 ENCOUNTER — Encounter (HOSPITAL_BASED_OUTPATIENT_CLINIC_OR_DEPARTMENT_OTHER): Payer: Self-pay

## 2022-01-25 ENCOUNTER — Emergency Department (HOSPITAL_BASED_OUTPATIENT_CLINIC_OR_DEPARTMENT_OTHER)
Admission: EM | Admit: 2022-01-25 | Discharge: 2022-01-26 | Disposition: A | Discharge status: Home / Self Care | Payer: Medicare Other | Attending: Emergency Medicine | Admitting: Emergency Medicine

## 2022-01-25 DIAGNOSIS — Z20822 Contact with and (suspected) exposure to covid-19: Secondary | ICD-10-CM | POA: Insufficient documentation

## 2022-01-25 DIAGNOSIS — M79672 Pain in left foot: Secondary | ICD-10-CM | POA: Diagnosis not present

## 2022-01-25 DIAGNOSIS — R112 Nausea with vomiting, unspecified: Secondary | ICD-10-CM | POA: Diagnosis present

## 2022-01-25 DIAGNOSIS — F17210 Nicotine dependence, cigarettes, uncomplicated: Secondary | ICD-10-CM | POA: Diagnosis not present

## 2022-01-25 LAB — SARS CORONAVIRUS 2 BY RT PCR: SARS Coronavirus 2 by RT PCR: NEGATIVE

## 2022-01-25 NOTE — ED Triage Notes (Signed)
Pt to ED requesting "work note" Reports has been throwing up x 2 days. Reports only 1 time today, and needs note for job. Pt also has cough. Reports known sick contact; son. Pt also concerned for left foot. Reports painful heel x 3 weeks. No known injury.

## 2022-01-26 ENCOUNTER — Emergency Department (HOSPITAL_BASED_OUTPATIENT_CLINIC_OR_DEPARTMENT_OTHER): Payer: Medicare Other

## 2022-01-26 MED ORDER — ONDANSETRON 4 MG PO TBDP
8.0000 mg | ORAL_TABLET | Freq: Once | ORAL | Status: AC
Start: 2022-01-26 — End: 2022-01-26
  Administered 2022-01-26: 8 mg via ORAL
  Filled 2022-01-26: qty 2

## 2022-01-26 MED ORDER — ONDANSETRON HCL 8 MG PO TABS: 8.0000 mg | ORAL_TABLET | Freq: Three times a day (TID) | ORAL | 0 refills | Status: DC | PRN

## 2022-01-26 NOTE — ED Notes (Signed)
Pt refusing to stay for xray results, requests to be dc'd now.  EDP made aware.

## 2022-01-26 NOTE — ED Notes (Signed)
Pt upset with her wait time, explained the delay.  Pt continues to c/o nausea but is more concerned with her foot

## 2022-01-26 NOTE — ED Provider Notes (Addendum)
[facility] Provider Note: Kathryn Spurling, MD, FACEP  CSN: [id number] MRN: [id number] ARRIVAL: 01/25/22 at 2003 ROOM: [place]  01/26/22 12:38 AM Kathryn Mcneil is a 41 y.o. female with 2 days of vomiting.  She only vomited 1 time yesterday but is still currently nauseated.  She is here requesting a note for her job.  She has also had a cough.  Her son was recently ill and may have gotten a viral illness from him.    She has also been having pain in her left heel for about the past 3 weeks.  There is a tender mass in the center of the plantar aspect of her left heel.  There was no initiating injury.  She rates this pain as a 4 out of 10, worse with weightbearing.  It has not improved with over-the-counter NSAIDs.   Past Medical History:  Diagnosis Date   Abnormal Pap smear    Anemia    Benign neoplasm of pituitary gland (HCC)    Bipolar 1 disorder, depressed ([place])    biopolar   Depression    Galactorrhea    Migraine    without aura   Nausea    Ulcer     Past Surgical History:  Procedure Laterality Date   APPENDECTOMY     CARPECTOMY WITH RADIAL STYLOIDECTOMY Right 03/02/2018   Procedure: PROXIMAL ROW CARPECTOMY RIGHT WRIST WITH POSSIBLE RADIAL STYLOIDECTOMY, POSTERIOR INTEROSSEOUS NERVE RESECTION;  Surgeon: Daryll Brod, MD;  Location: [facility];  Service: Orthopedics;  Laterality: Right;  AXILLARY BLOCK   LEEP     leep   TUBAL LIGATION      Family History  Problem Relation Age of Onset   Hypertension Mother    Ulcers Mother        gastric   Diabetes Maternal Grandmother    Cancer Maternal Grandfather     Social History   Tobacco Use   Smoking status: Every Day    Packs/day: 1.00    Types: Cigarettes   Smokeless tobacco: Never   Tobacco comments:    1 pkg a day.  Vaping Use   Vaping Use: Some days  Substance Use Topics   Alcohol use: Not Currently   Drug use: Yes     Types: Marijuana    Prior to Admission medications   Medication Sig Start Date End Date Taking? Authorizing Provider  ondansetron (ZOFRAN) 8 MG tablet Take 1 tablet (8 mg total) by mouth every 8 (eight) hours as needed for nausea or vomiting. 01/26/22  Yes Avin Upperman, MD    Allergies Patient has no known allergies.   REVIEW OF SYSTEMS  Negative except as noted here or in the History of Present Illness.   PHYSICAL EXAMINATION  Initial Vital Signs Blood pressure 107/77, pulse 78, temperature 97.8 F (36.6 C), temperature source Oral, resp. rate 18, height '5\' 4"'$  (1.626 m), weight 54.4 kg, last menstrual period 01/18/2022, SpO2 100 %.  Examination General: Well-developed, well-nourished female in no acute distress; appearance consistent with age of record HENT: normocephalic; atraumatic Eyes: Normal appearance Neck: supple Heart: regular rate and rhythm Lungs: clear to auscultation bilaterally Abdomen: soft; nondistended; nontender; bowel sounds present Extremities: Tender mass of center of plantar aspect of left heel without erythema or fluctuance Neurologic: Awake, alert and oriented; motor function intact in all extremities and symmetric; no facial droop Skin: Warm and dry Psychiatric: Normal mood and affect  RESULTS  Summary of this visit's results, reviewed and interpreted by myself:   EKG Interpretation  Date/Time:    Ventricular Rate:    PR Interval:    QRS Duration:   QT Interval:    QTC Calculation:   R Axis:     Text Interpretation:         Laboratory Studies: Results for orders placed or performed during the hospital encounter of 01/25/22 (from the past 24 hour(s))  SARS Coronavirus 2 by RT PCR (hospital order, performed in [facility] hospital lab) *cepheid single result test* Anterior Nasal Swab     Status: None   Collection Time: 01/25/22  8:44 PM   Specimen: Anterior Nasal Swab  Result Value Ref Range   SARS Coronavirus 2 by RT PCR NEGATIVE  NEGATIVE   Imaging Studies: DG Foot 2 Views Left  Result Date: 01/26/2022 CLINICAL DATA:  Heel pain for 3 weeks EXAM: LEFT FOOT - 2 VIEW COMPARISON:  None Available. FINDINGS: There is no evidence of fracture or dislocation. There is no evidence of arthropathy or other focal bone abnormality. Soft tissues are unremarkable. IMPRESSION: Negative. Electronically Signed   By: Placido Sou M.D.   On: 01/26/2022 01:01    ED COURSE and MDM  Nursing notes, initial and subsequent vitals signs, including pulse oximetry, reviewed and interpreted by myself.  Vitals:   01/25/22 2041 01/25/22 2042  BP:  107/77  Pulse:  78  Resp:  18  Temp:  97.8 F (36.6 C)  TempSrc:  Oral  SpO2:  100%  Weight: 54.4 kg   Height: '5\' 4"'$  (1.626 m)    Medications  ondansetron (ZOFRAN-ODT) disintegrating tablet 8 mg (8 mg Oral Given 01/26/22 0049)   12:55 AM The patient does not wish to wait any longer for her foot radiographs to be formally read.  I have reviewed the films and do not see any bony mass of the calcaneus.  Clinically the mass appears to be contained within the soft tissue.  We will refer her to podiatry for further evaluation as the exact cause of her symptoms is unclear to me.  We will also provide Zofran for her nausea.   PROCEDURES  Procedures   ED DIAGNOSES     ICD-10-CM   1. Nausea and vomiting in adult  R11.2     2. Pain of left heel  M79.672          Shanon Rosser, MD 01/26/22 0057    Shanon Rosser, MD 01/26/22 [phone]

## 2022-01-26 NOTE — ED Notes (Signed)
0100 Pt refused DC vitals, states " I have been here long enough"

## 2022-01-26 NOTE — ED Notes (Signed)
X-ray at bedside

## 2022-04-29 ENCOUNTER — Emergency Department (HOSPITAL_BASED_OUTPATIENT_CLINIC_OR_DEPARTMENT_OTHER)
Admission: EM | Admit: 2022-04-29 | Discharge: 2022-04-29 | Disposition: A | Discharge status: Home / Self Care | Payer: Medicare Other | Attending: Emergency Medicine | Admitting: Emergency Medicine

## 2022-04-29 ENCOUNTER — Encounter (HOSPITAL_BASED_OUTPATIENT_CLINIC_OR_DEPARTMENT_OTHER): Payer: Self-pay

## 2022-04-29 ENCOUNTER — Emergency Department (HOSPITAL_BASED_OUTPATIENT_CLINIC_OR_DEPARTMENT_OTHER): Payer: Medicare Other

## 2022-04-29 ENCOUNTER — Other Ambulatory Visit: Payer: Self-pay

## 2022-04-29 DIAGNOSIS — S92351A Displaced fracture of fifth metatarsal bone, right foot, initial encounter for closed fracture: Secondary | ICD-10-CM | POA: Insufficient documentation

## 2022-04-29 DIAGNOSIS — W19XXXA Unspecified fall, initial encounter: Secondary | ICD-10-CM | POA: Diagnosis not present

## 2022-04-29 DIAGNOSIS — M7989 Other specified soft tissue disorders: Secondary | ICD-10-CM | POA: Diagnosis not present

## 2022-04-29 DIAGNOSIS — S99921A Unspecified injury of right foot, initial encounter: Secondary | ICD-10-CM | POA: Diagnosis present

## 2022-04-29 MED ORDER — OXYCODONE-ACETAMINOPHEN 5-325 MG PO TABS
2.0000 | ORAL_TABLET | Freq: Once | ORAL | Status: AC
  Administered 2022-04-29: 2 via ORAL
  Filled 2022-04-29: qty 2

## 2022-04-29 MED ORDER — OXYCODONE-ACETAMINOPHEN 5-325 MG PO TABS: 1.0000 | ORAL_TABLET | Freq: Four times a day (QID) | ORAL | 0 refills | Status: DC | PRN

## 2022-04-29 NOTE — ED Provider Notes (Signed)
[facility] EMERGENCY DEPARTMENT Provider Note   CSN: [id number] Arrival date & time: 04/29/22  2208     History  Chief Complaint  Patient presents with   Foot Pain    Kathryn Mcneil is a 41 y.o. female here presenting with right foot injury.  Patient states that her son picked her up and accidentally dropped her and she landed on the right foot.  She states that she has progressive right foot swelling afterwards.  Denies any other injury.  She applied ice on it but no meds prior to arrival.  The history is provided by the patient.       Home Medications Prior to Admission medications   Medication Sig Start Date End Date Taking? Authorizing Provider  ondansetron (ZOFRAN) 8 MG tablet Take 1 tablet (8 mg total) by mouth every 8 (eight) hours as needed for nausea or vomiting. 01/26/22   Molpus, Jenny Reichmann, MD      Allergies    Patient has no known allergies.    Review of Systems   Review of Systems  Musculoskeletal:        Right foot pain  All other systems reviewed and are negative.   Physical Exam Updated Vital Signs BP 113/88 (BP Location: Right Arm)   Pulse 90   Temp (!) 97.4 F (36.3 C) (Oral) Comment: Pt just had icy drink  Resp 18   Ht '5\' 4"'$  (1.626 m)   Wt 51.7 kg   LMP 04/20/2022   SpO2 100%   BMI 19.57 kg/m  Physical Exam Vitals and nursing note reviewed.  Constitutional:      Comments: Uncomfortable   HENT:     Head: Normocephalic.     Nose: Nose normal.     Mouth/Throat:     Mouth: Mucous membranes are moist.  Eyes:     Extraocular Movements: Extraocular movements intact.     Pupils: Pupils are equal, round, and reactive to light.  Cardiovascular:     Rate and Rhythm: Normal rate.     Pulses: Normal pulses.  Pulmonary:     Effort: Pulmonary effort is normal.  Abdominal:     General: Abdomen is flat.  Musculoskeletal:     Cervical back: Normal range of motion.     Comments: Swelling of the dorsal aspect of the right fourth and fifth  midfoot.  There is no ankle tenderness or deformity.  No tib-fib tenderness.  2+ DP pulse and patient able to wiggle toes  Skin:    General: Skin is warm.     Capillary Refill: Capillary refill takes less than 2 seconds.  Neurological:     General: No focal deficit present.     Mental Status: She is oriented to person, place, and time.  Psychiatric:        Mood and Affect: Mood normal.        Behavior: Behavior normal.     ED Results / Procedures / Treatments   Labs (all labs ordered are listed, but only abnormal results are displayed) Labs Reviewed - No data to display  EKG None  Radiology DG Foot Complete Right  Result Date: 04/29/2022 CLINICAL DATA:  R foot injury EXAM: RIGHT FOOT COMPLETE - 3+ VIEW COMPARISON:  None Available. FINDINGS: Couple consecutive transverse fractures of the fifth digit metatarsal approximately 6 mm and 15 mm away from the base. Finding could also represent a comminuted fracture. There is no evidence of severe arthropathy or other focal bone abnormality. Associated subcutaneus soft  tissue edema. IMPRESSION: Couple consecutive transverse fractures of the fifth digit metatarsal approximately 6 mm and 15 mm away from the base. Finding may represent an underlying single comminuted fracture. Electronically Signed   By: Iven Finn M.D.   On: 04/29/2022 22:44    Procedures Procedures    Medications Ordered in ED Medications  oxyCODONE-acetaminophen (PERCOCET/ROXICET) 5-325 MG per tablet 2 tablet (2 tablets Oral Given 04/29/22 2221)    ED Course/ Medical Decision Making/ A&P                           Medical Decision Making Kathryn Mcneil is a 41 y.o. female here presenting with right foot pain after injury.  Considered contusion versus fracture.  Plan to get foot x-rays and will give pain medicine and reassess.  10:52 PM X-ray showed fifth digit metatarsal fracture.  Concern for possible Jones fracture.  Patient was put in a posterior splint and  given crutches.  I told her not to bear weight on it until she follows up with orthopedic doctor.  Will prescribe pain medicine.  Problems Addressed: Closed displaced fracture of fifth metatarsal bone of right foot, initial encounter: acute illness or injury  Amount and/or Complexity of Data Reviewed Radiology: ordered and independent interpretation performed. Decision-making details documented in ED Course.  Risk Prescription drug management.  Final Clinical Impression(s) / ED Diagnoses Final diagnoses:  None    Rx / DC Orders ED Discharge Orders     None         Drenda Freeze, MD 04/29/22 2254

## 2022-04-29 NOTE — Discharge Instructions (Addendum)
You have a metatarsal fracture.  Please keep the splint on and use crutches and do not bear weight on it.  Take Percocet for severe pain and Motrin for mild pain and apply ice on it.  You need to call your orthopedic doctor for follow-up next week.  You may need surgery.    Return to ER if you have worse foot pain, toes turning blue.

## 2022-04-29 NOTE — ED Triage Notes (Signed)
Pt fell on her RT foot about 1.5 hours and heard a pop. Pt with pedal pulse intact +2. Pt has swelling to top of outer RT foot.

## 2022-05-04 ENCOUNTER — Telehealth: Payer: Self-pay | Admitting: Sports Medicine

## 2022-05-04 ENCOUNTER — Encounter: Payer: Self-pay | Admitting: Sports Medicine

## 2022-05-04 ENCOUNTER — Ambulatory Visit (INDEPENDENT_AMBULATORY_CARE_PROVIDER_SITE_OTHER): Payer: Medicare Other | Admitting: Sports Medicine

## 2022-05-04 ENCOUNTER — Ambulatory Visit (INDEPENDENT_AMBULATORY_CARE_PROVIDER_SITE_OTHER): Payer: Medicare Other

## 2022-05-04 DIAGNOSIS — S93401A Sprain of unspecified ligament of right ankle, initial encounter: Secondary | ICD-10-CM

## 2022-05-04 DIAGNOSIS — S92351A Displaced fracture of fifth metatarsal bone, right foot, initial encounter for closed fracture: Secondary | ICD-10-CM | POA: Diagnosis not present

## 2022-05-04 DIAGNOSIS — F17200 Nicotine dependence, unspecified, uncomplicated: Secondary | ICD-10-CM | POA: Diagnosis not present

## 2022-05-04 MED ORDER — TRAMADOL HCL 50 MG PO TABS: 50.0000 mg | ORAL_TABLET | Freq: Four times a day (QID) | ORAL | 0 refills | Status: AC | PRN

## 2022-05-04 MED ORDER — TRAMADOL HCL 50 MG PO TABS: 50.0000 mg | ORAL_TABLET | Freq: Four times a day (QID) | ORAL | 0 refills | Status: DC | PRN

## 2022-05-04 NOTE — Telephone Encounter (Signed)
Pt called in stating that Dr. [name] sent over medication (traMADol (ULTRAM) 50 MG tablet) to the wrong pharmacy... Pt is requesting for medication to be sent to [facility] on [address] in [place]... Pt requesting callback.Marland KitchenMarland Kitchen

## 2022-05-04 NOTE — Progress Notes (Signed)
Kathryn Mcneil - 41 y.o. female MRN [id number]  Date of birth: [birth year]  Office Visit Note: Visit Date: 05/04/2022 PCP: Pcp, No Referred by: No ref. provider found  Subjective: Chief Complaint  Patient presents with   Right Foot - Fracture   HPI: Kathryn Mcneil is a pleasant 41 y.o. female who presents today for right foot injury with fracture of the base of the fifth metatarsal.  She had an injury last week when she and her son were horse playing tennis and went to pick her up and dropped her on the foot, which she landed awkwardly on his foot and had immediate pain and swelling.  Also noticed some bruising.  Was seen in the emergency department on 04/29/2022.  She had x-rays which showed a comminuted fifth avulsion fracture.  She was placed in a posterior splint and provided with crutches.  Today, she states her pain is still fairly significant over the anterolateral foot.  She does note some swelling and some bruising.  Reporting some tingling and burning sensation in the midfoot.  She has been in a posterior splint, nonweightbearing with crutches.  She is alternating Tylenol as well as the Percocet which was given in the emergency room.  She just finished her last dose of Percocet today.  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  - Previous fractures: None - Tobacco use: Yes, smokes 1 pack per day - ETOH use: None - No history of DM  Pertinent ROS were reviewed with the patient and found to be negative unless otherwise specified above in HPI.   Assessment & Plan: Visit Diagnoses:  1. Closed displaced fracture of fifth metatarsal bone of right foot, initial encounter   2. Tobacco use disorder   3. Inversion sprain of right ankle, initial encounter    Plan: Discussed and reviewed Evalee's x-rays with her in the room.  She has suffered an avulsion fracture of the fifth metatarsal, this is comminuted, which does slightly increase  risk of nonunion.  I do think this is just proximal to the Jones location, discussed all treatment options such as trial of nonoperative management with short leg walking boot and crutches.  Did discuss the nature that the if the fracture were to progress or show signs of not healing nonoperatively, surgical fixation with ORIF and pinning could be a treatment option as well.  She wishes to proceed with nonoperative treatment at this time. She does smoke 1 pack of cigarettes per day, counseled her on decreasing or stopping these altogether as this will certainly slow her bone healing.  May use ice, heat and occasional anti-inflammatories for pain control for the foot and ankle. Did provide short course of Tramadol for breakthrough pain only. Will not refill this going forward.  She will follow-up in about 10 days for reevaluation and repeat x-ray.  Follow-up: Return in about 2 weeks (around 05/18/2022) for for foot f/u with repeat x-rays.   Meds & Orders:  Meds ordered this encounter  Medications   traMADol (ULTRAM) 50 MG tablet    Sig: Take 1 tablet (50 mg total) by mouth every 6 (six) hours as needed for up to 4 days for severe pain.    Dispense:  16 tablet    Refill:  0    Orders Placed This Encounter  Procedures   XR Foot Complete Right     Procedures: No procedures performed      Clinical History: No  specialty comments available.  She reports that she has been smoking cigarettes. She has been smoking an average of 1 pack per day. She has never used smokeless tobacco. No results for input(s): "HGBA1C", "LABURIC" in the last 8760 hours.  Objective:   Vital Signs: LMP 04/20/2022   Physical Exam  Gen: Well-appearing, in no acute distress; non-toxic CV: Regular Rate. Well-perfused. Warm.  Resp: Breathing unlabored on room air; no wheezing. Psych: Fluid speech in conversation; appropriate affect; normal thought process Neuro: Sensation intact throughout. No gross coordination deficits.    Ortho Exam - Right foot/ankle: There is tenderness to palpation over the base of the fifth metatarsal.  There is notable soft tissue swelling and some ecchymosis over the anterior lateral foot.  Able to wiggle all 5 toes without difficulty.  No pain with EHL testing or provocative testing for Lisfranc.  Distally.  Able to partial weight-bear with crutches in the boot.  No pain at either malleoli.  Imaging: XR Foot Complete Right  Result Date: 05/04/2022 3 views of the right foot including AP, oblique and lateral films were ordered and reviewed by myself.  X-rays demonstrate a comminuted fracture of the tuberosity of the proximal fifth metatarsal.  This does appear just proximal to the diaphyseal-metaphyseal junction.  The furthest fracture line is about 15 mm away from the base.  There is some mild displacement of the distal fracture line.  Soft tissue swelling noted.   Past Medical/Family/Surgical/Social History: Medications & Allergies reviewed per EMR, new medications updated. Patient Active Problem List   Diagnosis Date Noted   History of cervical dysplasia 11/08/2012   Past Medical History:  Diagnosis Date   Abnormal Pap smear    Anemia    Benign neoplasm of pituitary gland (HCC)    Bipolar 1 disorder, depressed ([place])    biopolar   Depression    Galactorrhea    Migraine    without aura   Nausea    Ulcer    Family History  Problem Relation Age of Onset   Hypertension Mother    Ulcers Mother        gastric   Diabetes Maternal Grandmother    Cancer Maternal Grandfather    Past Surgical History:  Procedure Laterality Date   APPENDECTOMY     CARPECTOMY WITH RADIAL STYLOIDECTOMY Right 03/02/2018   Procedure: PROXIMAL ROW CARPECTOMY RIGHT WRIST WITH POSSIBLE RADIAL STYLOIDECTOMY, POSTERIOR INTEROSSEOUS NERVE RESECTION;  Surgeon: Daryll Brod, MD;  Location: [facility];  Service: Orthopedics;  Laterality: Right;  AXILLARY BLOCK   LEEP     leep   TUBAL  LIGATION     Social History   Occupational History   Not on file  Tobacco Use   Smoking status: Every Day    Packs/day: 1.00    Types: Cigarettes   Smokeless tobacco: Never   Tobacco comments:    1 pkg a day.  Vaping Use   Vaping Use: Some days  Substance and Sexual Activity   Alcohol use: Not Currently   Drug use: Yes    Types: Marijuana   Sexual activity: Yes    Birth control/protection: Surgical

## 2022-05-04 NOTE — Addendum Note (Signed)
Addended by: Renne Musca III on: 05/04/2022 08:59 PM   Modules accepted: Orders

## 2022-05-04 NOTE — Progress Notes (Signed)
Injury last week when her and her son were messing around, her son went to pick her up and dropped her She landed on her foot awkwardly  There is swelling in the foot and bruising She has been NWB with splint/crutches States they gave her 60 oxy In discharge  She ran out and is still in a lot of pain

## 2022-05-05 NOTE — Telephone Encounter (Signed)
Left a voicemail for patient to inform them that RX has been called in.

## 2022-05-18 ENCOUNTER — Ambulatory Visit (INDEPENDENT_AMBULATORY_CARE_PROVIDER_SITE_OTHER): Payer: Medicare Other | Admitting: Sports Medicine

## 2022-05-18 ENCOUNTER — Encounter: Payer: Self-pay | Admitting: Sports Medicine

## 2022-05-18 ENCOUNTER — Ambulatory Visit (INDEPENDENT_AMBULATORY_CARE_PROVIDER_SITE_OTHER): Payer: Medicare Other

## 2022-05-18 DIAGNOSIS — S92351A Displaced fracture of fifth metatarsal bone, right foot, initial encounter for closed fracture: Secondary | ICD-10-CM

## 2022-05-18 DIAGNOSIS — F17200 Nicotine dependence, unspecified, uncomplicated: Secondary | ICD-10-CM

## 2022-05-18 NOTE — Progress Notes (Signed)
Kathryn Mcneil - 42 y.o. female MRN [id number]  Date of birth: 03-15-1981  Office Visit Note: Visit Date: 05/18/2022 PCP: Pcp, No Referred by: No ref. provider found  Subjective: Chief Complaint  Patient presents with   Right Foot - Pain   HPI: Kathryn Mcneil is a pleasant 42 y.o. female who presents today for follow-up of 5th metatarsal fracture.  DOI: 04/29/22  She still has some pain over the lateral foot although it is improved from prior visit.  She is taking ibuprofen 3 times a day as needed for pain.  She no longer is needing the crutches to ambulate, continues in the short leg walking boot.  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  Continues to smoke 1 pack per day.  Pertinent ROS were reviewed with the patient and found to be negative unless otherwise specified above in HPI.   Assessment & Plan: Visit Diagnoses:  1. Closed displaced fracture of fifth metatarsal bone of right foot, initial encounter   2. Tobacco use disorder    Plan: Discussed with Kathryn Mcneil the nature of her zone 1, fifth metatarsal fracture.  Still believe this would heal with nonoperative management.  We did discuss surgical options with the patient, and I did discuss this with our foot Ortho, Dr. Sharol Given.  Given the region in her smoking use, we believe nonoperative management is the best way to go for now. She will continue in the short leg walking boot.  Did recommend she offload the foot and rest, elevate and ice this as much as possible.  Will bring her back in 1 month for repeat x-rays to evaluate for radiographic healing.  May alternate Tylenol and ibuprofen for pain control, cautioned her on continuous NSAID use for theoretical risk of delaying bone healing. Recommend tobacco cessation or cutting back as much as possible.  Follow-up: Return in about 4 weeks (around 06/15/2022) for with Dr. [name] repeat x-rays of R-foot.   Meds & Orders: No orders of the defined types  were placed in this encounter.   Orders Placed This Encounter  Procedures   XR Foot Complete Right     Procedures: No procedures performed      Clinical History: No specialty comments available.  She reports that she has been smoking cigarettes. She has been smoking an average of 1 pack per day. She has never used smokeless tobacco. No results for input(s): "HGBA1C", "LABURIC" in the last 8760 hours.  Objective:   Vital Signs: LMP 04/20/2022   Physical Exam  Gen: Well-appearing, in no acute distress; non-toxic CV: Regular Rate. Well-perfused. Warm.  Resp: Breathing unlabored on room air; no wheezing. Psych: Fluid speech in conversation; appropriate affect; normal thought process Neuro: Sensation intact throughout. No gross coordination deficits.   Ortho Exam - Right foot: There is some soft tissue swelling and positive TTP over the base of the fifth metatarsal.  Able to perform active ankle plantarflexion and dorsiflexion without much pain.  There is some residual ecchymosis on the dorsum of the lateral forefoot.  Neurovascularly intact distally.  Imaging: XR Foot Complete Right  Result Date: 05/18/2022 3 views of the right foot including AP, oblique and lateral films were ordered and reviewed by myself.  X-rays redemonstrate a comminuted fracture of the tuberosity of the proximal fifth metatarsal, this is near the metaphyseal region, it is proximal to the diaphysis.  There is some mild displacement of the distal fracture line.  No bony callus formation yet.  Past Medical/Family/Surgical/Social History: Medications & Allergies reviewed per EMR, new medications updated. Patient Active Problem List   Diagnosis Date Noted   History of cervical dysplasia 11/08/2012   Past Medical History:  Diagnosis Date   Abnormal Pap smear    Anemia    Benign neoplasm of pituitary gland (HCC)    Bipolar 1 disorder, depressed ([place])    biopolar   Depression    Galactorrhea    Migraine     without aura   Nausea    Ulcer    Family History  Problem Relation Age of Onset   Hypertension Mother    Ulcers Mother        gastric   Diabetes Maternal Grandmother    Cancer Maternal Grandfather    Past Surgical History:  Procedure Laterality Date   APPENDECTOMY     CARPECTOMY WITH RADIAL STYLOIDECTOMY Right 03/02/2018   Procedure: PROXIMAL ROW CARPECTOMY RIGHT WRIST WITH POSSIBLE RADIAL STYLOIDECTOMY, POSTERIOR INTEROSSEOUS NERVE RESECTION;  Surgeon: Daryll Brod, MD;  Location: [facility];  Service: Orthopedics;  Laterality: Right;  AXILLARY BLOCK   LEEP     leep   TUBAL LIGATION     Social History   Occupational History   Not on file  Tobacco Use   Smoking status: Every Day    Packs/day: 1.00    Types: Cigarettes   Smokeless tobacco: Never   Tobacco comments:    1 pkg a day.  Vaping Use   Vaping Use: Some days  Substance and Sexual Activity   Alcohol use: Not Currently   Drug use: Yes    Types: Marijuana   Sexual activity: Yes    Birth control/protection: Surgical

## 2022-05-18 NOTE — Progress Notes (Signed)
States the foot is feeling a little better She is in the boot today; but off the crutches

## 2022-06-15 ENCOUNTER — Ambulatory Visit: Payer: Medicare Other | Admitting: Sports Medicine

## 2022-06-15 NOTE — Progress Notes (Incomplete)
   Kathryn Mcneil - 42 y.o. female MRN [id number]  Date of birth: 24-Jun-1980  Office Visit Note: Visit Date: 06/15/2022 PCP: Pcp, No Referred by: No ref. provider found  Subjective: No chief complaint on file.  HPI: Kathryn Mcneil is a pleasant 42 y.o. female who presents today for follow-up of 5th metatarsal fracture.   DOI: 04/29/22  Today, her pain is ***  Pertinent ROS were reviewed with the patient and found to be negative unless otherwise specified above in HPI.   Assessment & Plan: Visit Diagnoses: No diagnosis found.  Plan: ***  Follow-up: No follow-ups on file.   Meds & Orders: No orders of the defined types were placed in this encounter.  No orders of the defined types were placed in this encounter.    Procedures: No procedures performed      Clinical History: No specialty comments available.  She reports that she has been smoking cigarettes. She has been smoking an average of 1 pack per day. She has never used smokeless tobacco. No results for input(s): "HGBA1C", "LABURIC" in the last 8760 hours.  Objective:   Vital Signs: There were no vitals taken for this visit.  Physical Exam  Gen: Well-appearing, in no acute distress; non-toxic CV: Regular Rate. Well-perfused. Warm.  Resp: Breathing unlabored on room air; no wheezing. Psych: Fluid speech in conversation; appropriate affect; normal thought process Neuro: Sensation intact throughout. No gross coordination deficits.   Ortho Exam - ***  Imaging: No results found.  Past Medical/Family/Surgical/Social History: Medications & Allergies reviewed per EMR, new medications updated. Patient Active Problem List   Diagnosis Date Noted  . History of cervical dysplasia 11/08/2012   Past Medical History:  Diagnosis Date  . Abnormal Pap smear   . Anemia   . Benign neoplasm of pituitary gland ([place])   . Bipolar 1 disorder, depressed ([place])    biopolar  . Depression   . Galactorrhea   . Migraine     without aura  . Nausea   . Ulcer    Family History  Problem Relation Age of Onset  . Hypertension Mother   . Ulcers Mother        gastric  . Diabetes Maternal Grandmother   . Cancer Maternal Grandfather    Past Surgical History:  Procedure Laterality Date  . APPENDECTOMY    . CARPECTOMY WITH RADIAL STYLOIDECTOMY Right 03/02/2018   Procedure: PROXIMAL ROW CARPECTOMY RIGHT WRIST WITH POSSIBLE RADIAL STYLOIDECTOMY, POSTERIOR INTEROSSEOUS NERVE RESECTION;  Surgeon: Daryll Brod, MD;  Location: [facility];  Service: Orthopedics;  Laterality: Right;  AXILLARY BLOCK  . LEEP     leep  . TUBAL LIGATION     Social History   Occupational History  . Not on file  Tobacco Use  . Smoking status: Every Day    Packs/day: 1.00    Types: Cigarettes  . Smokeless tobacco: Never  . Tobacco comments:    1 pkg a day.  Vaping Use  . Vaping Use: Some days  Substance and Sexual Activity  . Alcohol use: Not Currently  . Drug use: Yes    Types: Marijuana  . Sexual activity: Yes    Birth control/protection: Surgical

## 2022-06-17 ENCOUNTER — Ambulatory Visit: Payer: Medicare Other | Admitting: Sports Medicine

## 2022-06-18 ENCOUNTER — Ambulatory Visit (INDEPENDENT_AMBULATORY_CARE_PROVIDER_SITE_OTHER): Payer: 59

## 2022-06-18 ENCOUNTER — Ambulatory Visit: Payer: Self-pay

## 2022-06-18 ENCOUNTER — Ambulatory Visit (INDEPENDENT_AMBULATORY_CARE_PROVIDER_SITE_OTHER): Payer: 59 | Admitting: Sports Medicine

## 2022-06-18 ENCOUNTER — Encounter: Payer: Self-pay | Admitting: Sports Medicine

## 2022-06-18 DIAGNOSIS — S92351D Displaced fracture of fifth metatarsal bone, right foot, subsequent encounter for fracture with routine healing: Secondary | ICD-10-CM

## 2022-06-18 DIAGNOSIS — M79671 Pain in right foot: Secondary | ICD-10-CM | POA: Diagnosis not present

## 2022-06-18 DIAGNOSIS — S92351A Displaced fracture of fifth metatarsal bone, right foot, initial encounter for closed fracture: Secondary | ICD-10-CM | POA: Diagnosis not present

## 2022-06-18 DIAGNOSIS — L84 Corns and callosities: Secondary | ICD-10-CM

## 2022-06-18 NOTE — Progress Notes (Addendum)
Kathryn Mcneil - 42 y.o. female MRN [id number]  Date of birth: [birth year]  Office Visit Note: Visit Date: 06/18/2022 PCP: Pcp, No Referred by: No ref. provider found  Subjective: Chief Complaint  Patient presents with   Right Foot - Follow-up   HPI: Kathryn Mcneil is a pleasant 42 y.o. female who presents today for Follow-up left fifth metatarsal fracture.  DOI: 04/29/22   ED -> posterior splint with crutches Placed in [facility] on 05/04/22  Today, she states her foot pain is significantly improving, although still somewhat painful.  She has mostly stayed in the short leg walking boot, although this comes out of it when she is walking around the house.  She is working on cutting back smoking.  Has gone from 1 pack/day to 1 pack every 2 days.   Also reports right heel pain with a callus on the plantar aspect of the heel.  This has been present for 5-6 months, although has been getting more painful.  Difficult to walk with this.  No known injury.  Pertinent ROS were reviewed with the patient and found to be negative unless otherwise specified above in HPI.   Assessment & Plan: Visit Diagnoses:  1. Closed displaced fracture of fifth metatarsal bone of right foot with routine healing, subsequent encounter   2. Corn of foot   3. Heel callus    Plan: Discussed with Tashiya that her x-ray does show a healing fifth metatarsal fracture, however this has not completed union yet.  She states her short leg walking boot is falling apart and she would like something that is not as cumbersome to ambulate.  We did transition her into a fracture shoe today.  She is to wear this when she is up and moving on the foot, may come out of this when sleeping and with minimal steps around the house.  She will follow-up in 3 weeks for repeat x-ray, 3 views, of the foot and hopefully at that time we may be able to transition him to regular tennis shoes.  Encouraged continued smoking reduction in attempt to aid in  her healing.  In terms of her contralateral heel callus/lesion, I would like her to see my partner, Dr. Sharol Given to evaluate this.  He will likely need to shave down, but I am suspicious for underlying fluid that may need drained.  Appreciate his recommendation.  We did place her into a silicone heel cup today which she found much more comfortable with walking.  Follow-up: Return in about 3 weeks (around 07/09/2022) for for f/u R-foot fx with Dr. [name] in 3 wks; may f/u for left heel lesion with Dr. Sharol Given when able .   Meds & Orders: No orders of the defined types were placed in this encounter.   Orders Placed This Encounter  Procedures   XR Foot 2 Views Right   [place] Extrem Low Left Ltd     Procedures: No procedures performed      Clinical History: No specialty comments available.  She reports that she has been smoking cigarettes. She has been smoking an average of 1 pack per day. She has never used smokeless tobacco. No results for input(s): "HGBA1C", "LABURIC" in the last 8760 hours.  Objective:    Physical Exam  Gen: Well-appearing, in no acute distress; non-toxic CV:  Well-perfused. Warm.  Resp: Breathing unlabored on room air; no wheezing. Psych: Fluid speech in conversation; appropriate affect; normal thought process Neuro: Sensation intact throughout. No gross coordination deficits.  Ortho Exam - Right foot: + Mild TTP although improved over the base of the fifth metatarsal.  There is no longer any significant soft tissue swelling.  Full active and passive range of motion about the ankle without pain.  No rashes.  Neurovascular intact distally.  Mild antalgic gait, although able to stand fully on foot.  - Left foot: On the plantar aspect of the heel there is a callus/corn that is quite tender to palpation, there is no surrounding redness, although it does feel somewhat fluctuant under this. There is also a small callus over the plantar aspect of the fifth foot,  nontender.  Imaging: [place] Extrem Low Left Ltd  Result Date: 06/18/2022 Limited MSK Ultrasound of the left lower extremity was performed. The calcaneus has no cortical irregularity. Over the plantar surface of the heel there is a mixed echogenic structure that is about 1.4 x 0.8 cm in nature. This is deep to the subcutaneous tissue and may represent fluid collection. No signifcant hyperemia. Evaluation of the heel fat pad and insertion of plantar fascia appears within normal limits.  XR Foot 2 Views Right  Result Date: 06/18/2022 2 views of the right foot including AP and lateral femoral ordered and reviewed by myself.  X-rays redemonstrate a comminuted fracture of the tuberosity of the proximal fifth metatarsal.  X-ray demonstrates a healing fracture that has not quite achieved union.  Bony callus formation present.     Past Medical/Family/Surgical/Social History: Medications & Allergies reviewed per EMR, new medications updated. Patient Active Problem List   Diagnosis Date Noted   History of cervical dysplasia 11/08/2012   Past Medical History:  Diagnosis Date   Abnormal Pap smear    Anemia    Benign neoplasm of pituitary gland (HCC)    Bipolar 1 disorder, depressed ([place])    biopolar   Depression    Galactorrhea    Migraine    without aura   Nausea    Ulcer    Family History  Problem Relation Age of Onset   Hypertension Mother    Ulcers Mother        gastric   Diabetes Maternal Grandmother    Cancer Maternal Grandfather    Past Surgical History:  Procedure Laterality Date   APPENDECTOMY     CARPECTOMY WITH RADIAL STYLOIDECTOMY Right 03/02/2018   Procedure: PROXIMAL ROW CARPECTOMY RIGHT WRIST WITH POSSIBLE RADIAL STYLOIDECTOMY, POSTERIOR INTEROSSEOUS NERVE RESECTION;  Surgeon: Daryll Brod, MD;  Location: [facility];  Service: Orthopedics;  Laterality: Right;  AXILLARY BLOCK   LEEP     leep   TUBAL LIGATION     Social History   Occupational History   Not  on file  Tobacco Use   Smoking status: Every Day    Packs/day: 1.00    Types: Cigarettes   Smokeless tobacco: Never   Tobacco comments:    1 pkg a day.  Vaping Use   Vaping Use: Some days  Substance and Sexual Activity   Alcohol use: Not Currently   Drug use: Yes    Types: Marijuana   Sexual activity: Yes    Birth control/protection: Surgical

## 2022-06-29 ENCOUNTER — Ambulatory Visit (INDEPENDENT_AMBULATORY_CARE_PROVIDER_SITE_OTHER): Payer: 59 | Admitting: Orthopedic Surgery

## 2022-06-29 ENCOUNTER — Encounter: Payer: Self-pay | Admitting: Orthopedic Surgery

## 2022-06-29 DIAGNOSIS — L97411 Non-pressure chronic ulcer of right heel and midfoot limited to breakdown of skin: Secondary | ICD-10-CM | POA: Diagnosis not present

## 2022-06-29 NOTE — Progress Notes (Signed)
Office Visit Note   Patient: Kathryn Mcneil           Date of Birth: 04-16-1981           MRN: [id number] Visit Date: 06/29/2022              Requested by: No referring provider defined for this encounter. PCP: Pcp, No  Chief Complaint  Patient presents with   Left Heel - Wound Check      HPI: Patient is a 42 year old woman who is seen in referral from Dr. [name].  Patient has had a painful hyperkeratotic lesion of the right heel.  Ultrasound with Dr. [name] showed fluid beneath the ulcer.  Assessment & Plan: Visit Diagnoses:  1. Non-pressure chronic ulcer of right heel and midfoot limited to breakdown of skin ([place])     Plan: Ulcer was debrided back to healthy viable tissue.  Patient will follow-up in 4 weeks.  Follow-Up Instructions: Return in about 4 weeks (around 07/27/2022).   Ortho Exam  Patient is alert, oriented, no adenopathy, well-dressed, normal affect, normal respiratory effort. Examination patient has a hyperkeratotic lesion on the right heel.  She has good pulses there is no redness no cellulitis.  After informed consent a 10 blade knife was used to debride the skin and soft tissue and excised the hyperkeratotic lesion.  Patient tolerated this well there is no signs of abscess or infection.  Imaging: No results found. No images are attached to the encounter.  Labs: Lab Results  Component Value Date   REPTSTATUS 07/30/2015 FINAL 07/26/2015   CULT  07/26/2015    NO GROUP A STREP (S.PYOGENES) ISOLATED Performed at [facility]      Lab Results  Component Value Date   ALBUMIN 4.3 02/05/2021   ALBUMIN 4.1 11/19/2020   ALBUMIN 4.4 02/04/2017    No results found for: "MG" No results found for: "VD25OH"  No results found for: "PREALBUMIN"    Latest Ref Rng & Units 02/05/2021    5:12 PM 11/19/2020   11:19 AM 02/04/2017    2:13 PM  CBC EXTENDED  WBC 4.0 - 10.5 K/uL 12.6  9.3  9.9   RBC 3.87 - 5.11 MIL/uL 3.99  4.20  4.33   Hemoglobin 12.0 -  15.0 g/dL 12.5  13.1  13.9   HCT 36.0 - 46.0 % 36.4  38.9  39.9   Platelets 150 - 400 K/uL 298  281  231   NEUT# 1.7 - 7.7 K/uL 9.4  7.0  7.5   Lymph# 0.7 - 4.0 K/uL 2.3  1.6  1.9      There is no height or weight on file to calculate BMI.  Orders:  No orders of the defined types were placed in this encounter.  No orders of the defined types were placed in this encounter.    Procedures: No procedures performed  Clinical Data: No additional findings.  ROS:  All other systems negative, except as noted in the HPI. Review of Systems  Objective: Vital Signs: There were no vitals taken for this visit.  Specialty Comments:  No specialty comments available.  PMFS History: Patient Active Problem List   Diagnosis Date Noted   History of cervical dysplasia 11/08/2012   Past Medical History:  Diagnosis Date   Abnormal Pap smear    Anemia    Benign neoplasm of pituitary gland (HCC)    Bipolar 1 disorder, depressed ([place])    biopolar   Depression    Galactorrhea  Migraine    without aura   Nausea    Ulcer     Family History  Problem Relation Age of Onset   Hypertension Mother    Ulcers Mother        gastric   Diabetes Maternal Grandmother    Cancer Maternal Grandfather     Past Surgical History:  Procedure Laterality Date   APPENDECTOMY     CARPECTOMY WITH RADIAL STYLOIDECTOMY Right 03/02/2018   Procedure: PROXIMAL ROW CARPECTOMY RIGHT WRIST WITH POSSIBLE RADIAL STYLOIDECTOMY, POSTERIOR INTEROSSEOUS NERVE RESECTION;  Surgeon: Daryll Brod, MD;  Location: [place];  Service: Orthopedics;  Laterality: Right;  AXILLARY BLOCK   LEEP     leep   TUBAL LIGATION     Social History   Occupational History   Not on file  Tobacco Use   Smoking status: Every Day    Packs/day: 1.00    Types: Cigarettes   Smokeless tobacco: Never   Tobacco comments:    1 pkg a day.  Vaping Use   Vaping Use: Some days  Substance and Sexual Activity   Alcohol use:  Not Currently   Drug use: Yes    Types: Marijuana   Sexual activity: Yes    Birth control/protection: Surgical

## 2022-07-09 ENCOUNTER — Encounter: Payer: Self-pay | Admitting: Sports Medicine

## 2022-07-09 ENCOUNTER — Ambulatory Visit (INDEPENDENT_AMBULATORY_CARE_PROVIDER_SITE_OTHER): Payer: 59

## 2022-07-09 ENCOUNTER — Ambulatory Visit (INDEPENDENT_AMBULATORY_CARE_PROVIDER_SITE_OTHER): Payer: 59 | Admitting: Sports Medicine

## 2022-07-09 DIAGNOSIS — F17210 Nicotine dependence, cigarettes, uncomplicated: Secondary | ICD-10-CM | POA: Diagnosis not present

## 2022-07-09 DIAGNOSIS — L97411 Non-pressure chronic ulcer of right heel and midfoot limited to breakdown of skin: Secondary | ICD-10-CM

## 2022-07-09 DIAGNOSIS — S92351D Displaced fracture of fifth metatarsal bone, right foot, subsequent encounter for fracture with routine healing: Secondary | ICD-10-CM

## 2022-07-09 DIAGNOSIS — F17200 Nicotine dependence, unspecified, uncomplicated: Secondary | ICD-10-CM

## 2022-07-09 NOTE — Progress Notes (Signed)
Kathryn Mcneil - 42 y.o. female MRN [id number]  Date of birth: [birth year]  Office Visit Note: Visit Date: 07/09/2022 PCP: Pcp, No Referred by: No ref. provider found  Subjective: Chief Complaint  Patient presents with   Right Foot - Fracture, Follow-up   HPI: Kathryn Mcneil is a pleasant 42 y.o. female who presents today for follow-up of left fifth metatarsal fracture.  DOI: 04/29/22    ED -> posterior splint with crutches Placed in [facility] on 05/04/22 At last visit on 06/18/2022 we did transition her out of the walker boot to a fracture shoe. Her pain is much improved.   Working on tobacco cessation --> used to smoke 1 pack per day, now currently she is smoking less.  Not quite a pack a day.  Sometimes 1 pack every 2 days.  Since last visit, she did see Dr. Sharol Given after my referral later for her skin ulcer.  This was debrided.  She reports improvement in pain.  [place].  There is a callus overlying.  She is following back up with him in 4 weeks.  Pertinent ROS were reviewed with the patient and found to be negative unless otherwise specified above in HPI.   Assessment & Plan: Visit Diagnoses:  1. Closed displaced fracture of fifth metatarsal bone of right foot with routine healing, subsequent encounter   2. Non-pressure chronic ulcer of right heel and midfoot limited to breakdown of skin ([place])   3. Tobacco use disorder    Plan: Discussed with Rheda today that she has both radiographic as well as clinical signs of 1/5 metatarsal fracture.  At this point, we will transfer her out of the fracture Xu and into a regular tennis shoe.  Did caution her over the next month on avoiding barefoot walking and importance of using supportive shoe wear as opposed to sandals or crocs.  She may use topical Voltaren gel or over-the-counter anti-inflammatories for any residual pain.  If she wishes to have a green sports insoles for support, she 100% at any time although this is not necessary when she  has pain with ambulating.  We did discuss the importance of tobacco cessation or cutting back on the number of cigarettes to allow as this adversely affects bony healing of her fracture as well as healing of her chronic ulcer of the right heel. She previously smoked 1 pack of cigarettes per day, has been down to less than 1 pack sometimes down to half pack per day. Smoking cessation techniques discussed. She has agreed to continue to work on this. Approximately 4 minutes were spent on discussion on tobacco use and cessation in the office today.  Follow-up: Return if symptoms worsen or fail to improve.   Meds & Orders: No orders of the defined types were placed in this encounter.   Orders Placed This Encounter  Procedures   XR Foot Complete Right     Procedures: No procedures performed      Clinical History: No specialty comments available.  She reports that she has been smoking cigarettes. She has been smoking an average of 1 pack per day. She has never used smokeless tobacco. No results for input(s): "HGBA1C", "LABURIC" in the last 8760 hours.  Objective:    Physical Exam  Gen: Well-appearing, in no acute distress; non-toxic CV: Well-perfused. Warm.  Resp: Breathing unlabored on room air; no wheezing. Psych: Fluid speech in conversation; appropriate affect; normal thought process Neuro: Sensation intact throughout. No gross coordination deficits.   Ortho Exam -  Right foot: There is no TTP over the base of the fifth metatarsal.  No soft tissue swelling.  There is full and active range of motion of the ankle without pain.  Able to stand on both feet without pain.  Mild calf atrophy.  - Left heel: There is a callus over the plantar aspect of the heel.  No surrounding fluctuance or fluid this visit.  Nontender, no redness.  Imaging: XR Foot Complete Right  Result Date: 07/09/2022 3 views of the right foot including AP, lateral and oblique views were ordered and reviewed by myself.   X-rays demonstrate a healed comminuted fracture of the tuberosity of the proximal fifth metatarsal.  Near union.  Lateral view shows a healed plantar gap from previous films.  There is bony callus remission present.   Past Medical/Family/Surgical/Social History: Medications & Allergies reviewed per EMR, new medications updated. Patient Active Problem List   Diagnosis Date Noted   History of cervical dysplasia 11/08/2012   Past Medical History:  Diagnosis Date   Abnormal Pap smear    Anemia    Benign neoplasm of pituitary gland (HCC)    Bipolar 1 disorder, depressed ([place])    biopolar   Depression    Galactorrhea    Migraine    without aura   Nausea    Ulcer    Family History  Problem Relation Age of Onset   Hypertension Mother    Ulcers Mother        gastric   Diabetes Maternal Grandmother    Cancer Maternal Grandfather    Past Surgical History:  Procedure Laterality Date   APPENDECTOMY     CARPECTOMY WITH RADIAL STYLOIDECTOMY Right 03/02/2018   Procedure: PROXIMAL ROW CARPECTOMY RIGHT WRIST WITH POSSIBLE RADIAL STYLOIDECTOMY, POSTERIOR INTEROSSEOUS NERVE RESECTION;  Surgeon: Daryll Brod, MD;  Location: [place];  Service: Orthopedics;  Laterality: Right;  AXILLARY BLOCK   LEEP     leep   TUBAL LIGATION     Social History   Occupational History   Not on file  Tobacco Use   Smoking status: Every Day    Packs/day: 1.00    Types: Cigarettes   Smokeless tobacco: Never   Tobacco comments:    1 pkg a day.  Vaping Use   Vaping Use: Some days  Substance and Sexual Activity   Alcohol use: Not Currently   Drug use: Yes    Types: Marijuana   Sexual activity: Yes    Birth control/protection: Surgical

## 2022-07-09 NOTE — Progress Notes (Signed)
States she is doing a lot better

## 2022-07-27 ENCOUNTER — Ambulatory Visit: Payer: 59 | Admitting: Orthopedic Surgery

## 2022-08-15 ENCOUNTER — Other Ambulatory Visit: Payer: Self-pay

## 2022-08-15 ENCOUNTER — Encounter (HOSPITAL_BASED_OUTPATIENT_CLINIC_OR_DEPARTMENT_OTHER): Payer: Self-pay

## 2022-08-15 ENCOUNTER — Emergency Department (HOSPITAL_BASED_OUTPATIENT_CLINIC_OR_DEPARTMENT_OTHER)
Admission: EM | Admit: 2022-08-15 | Discharge: 2022-08-15 | Disposition: A | Discharge status: Home / Self Care | Payer: 59 | Attending: Emergency Medicine | Admitting: Emergency Medicine

## 2022-08-15 DIAGNOSIS — M7989 Other specified soft tissue disorders: Secondary | ICD-10-CM | POA: Diagnosis not present

## 2022-08-15 DIAGNOSIS — R2232 Localized swelling, mass and lump, left upper limb: Secondary | ICD-10-CM | POA: Diagnosis not present

## 2022-08-15 NOTE — ED Provider Notes (Signed)
[facility] Provider Note   CSN: [id number] Arrival date & time: 08/15/22  1737     History {Add pertinent medical, surgical, social history, OB history to HPI:1} Chief Complaint  Patient presents with   Abscess    Kathryn Mcneil is a 42 y.o. female.  Ports a bump to the top of the left hand for the past month.  Denies any pain she states it is occasionally itchy, she has been squeezing it without any material coming out.  Denies redness or fevers.  She has never had this in the past.  She states that the appearance is bothersome to her and she wanted to have it evaluated.   Abscess      Home Medications Prior to Admission medications   Medication Sig Start Date End Date Taking? Authorizing Provider  ondansetron (ZOFRAN) 8 MG tablet Take 1 tablet (8 mg total) by mouth every 8 (eight) hours as needed for nausea or vomiting. 01/26/22   Molpus, Jenny Reichmann, MD      Allergies    Patient has no known allergies.    Review of Systems   Review of Systems  Physical Exam Updated Vital Signs BP 106/67   Pulse 94   Temp 98.2 F (36.8 C) (Oral)   Resp 16   LMP 08/11/2022   SpO2 99%  Physical Exam Vitals and nursing note reviewed.  Constitutional:      General: She is not in acute distress.    Appearance: She is well-developed.  HENT:     Head: Normocephalic and atraumatic.  Eyes:     Conjunctiva/sclera: Conjunctivae normal.  Cardiovascular:     Rate and Rhythm: Normal rate and regular rhythm.     Heart sounds: No murmur heard. Pulmonary:     Effort: Pulmonary effort is normal. No respiratory distress.     Breath sounds: Normal breath sounds.  Abdominal:     Palpations: Abdomen is soft.     Tenderness: There is no abdominal tenderness.  Musculoskeletal:        General: No swelling.     Cervical back: Neck supple.     Comments: Some of the left hand has approximately 1 cm diameter area of firm mobile subcutaneous mass.  No  overlying erythema or warmth.  No drainage, no fluctuance or induration.  Over the third metacarpal  Skin:    General: Skin is warm and dry.     Capillary Refill: Capillary refill takes less than 2 seconds.  Neurological:     General: No focal deficit present.     Mental Status: She is alert and oriented to person, place, and time.  Psychiatric:        Mood and Affect: Mood normal.     ED Results / Procedures / Treatments   Labs (all labs ordered are listed, but only abnormal results are displayed) Labs Reviewed - No data to display  EKG None  Radiology No results found.  Procedures Procedures  {Document cardiac monitor, telemetry assessment procedure when appropriate:1}  Medications Ordered in ED Medications - No data to display  ED Course/ Medical Decision Making/ A&P   {   Click here for ABCD2, HEART and other calculatorsREFRESH Note before signing :1}                          Medical Decision Making  ***  {Document critical care time when appropriate:1} {Document review of labs and clinical  decision tools ie heart score, Chads2Vasc2 etc:1}  {Document your independent review of radiology images, and any outside records:1} {Document your discussion with family members, caretakers, and with consultants:1} {Document social determinants of health affecting pt's care:1} {Document your decision making why or why not admission, treatments were needed:1} Final Clinical Impression(s) / ED Diagnoses Final diagnoses:  None    Rx / DC Orders ED Discharge Orders     None

## 2022-08-15 NOTE — ED Triage Notes (Signed)
Pt present with complaint of "knot" to top of left hand for one month. Complains of pain and pruritus area . No known injury

## 2022-08-15 NOTE — Discharge Instructions (Signed)
The mass on the back your hand looks to be a cyst..  Please follow-up with hand doctor to see if they would want to remove this or do any further testing.  Come back to the ER if you have signs of infection such as redness swelling pain or other worrisome changes.

## 2022-09-14 ENCOUNTER — Ambulatory Visit (INDEPENDENT_AMBULATORY_CARE_PROVIDER_SITE_OTHER): Payer: 59

## 2022-09-14 ENCOUNTER — Other Ambulatory Visit (HOSPITAL_COMMUNITY): Payer: Self-pay

## 2022-09-14 ENCOUNTER — Encounter (HOSPITAL_COMMUNITY): Payer: Self-pay

## 2022-09-14 ENCOUNTER — Ambulatory Visit (HOSPITAL_COMMUNITY)
Admission: EM | Admit: 2022-09-14 | Discharge: 2022-09-14 | Disposition: A | Discharge status: Home / Self Care | Payer: 59 | Attending: Physician Assistant | Admitting: Physician Assistant

## 2022-09-14 DIAGNOSIS — J4521 Mild intermittent asthma with (acute) exacerbation: Secondary | ICD-10-CM

## 2022-09-14 DIAGNOSIS — R0989 Other specified symptoms and signs involving the circulatory and respiratory systems: Secondary | ICD-10-CM | POA: Diagnosis not present

## 2022-09-14 MED ORDER — AEROCHAMBER PLUS FLO-VU LARGE MISC
Status: AC
  Filled 2022-09-14: qty 1

## 2022-09-14 MED ORDER — PREDNISONE 20 MG PO TABS
40.0000 mg | ORAL_TABLET | Freq: Every day | ORAL | 0 refills | Status: DC
  Filled 2022-09-14: qty 8, 4d supply, fill #0

## 2022-09-14 MED ORDER — ALBUTEROL SULFATE HFA 108 (90 BASE) MCG/ACT IN AERS
INHALATION_SPRAY | RESPIRATORY_TRACT | Status: AC
  Filled 2022-09-14: qty 6.7

## 2022-09-14 MED ORDER — ALBUTEROL SULFATE HFA 108 (90 BASE) MCG/ACT IN AERS
2.0000 | INHALATION_SPRAY | Freq: Once | RESPIRATORY_TRACT | Status: AC
  Administered 2022-09-14: 2 via RESPIRATORY_TRACT

## 2022-09-14 MED ORDER — BUDESONIDE-FORMOTEROL FUMARATE 80-4.5 MCG/ACT IN AERO
2.0000 | INHALATION_SPRAY | Freq: Two times a day (BID) | RESPIRATORY_TRACT | 0 refills | Status: DC | PRN
  Filled 2022-09-14: qty 10.2, 30d supply, fill #0

## 2022-09-14 MED ORDER — AEROCHAMBER PLUS FLO-VU LARGE MISC
1.0000 | Freq: Once | Status: AC
  Administered 2022-09-14: 1

## 2022-09-14 NOTE — ED Provider Notes (Signed)
[facility]    CSN: [id number] Arrival date & time: 09/14/22  1636      History   Chief Complaint Chief Complaint  Patient presents with   Cough    HPI Kathryn Mcneil is a 42 y.o. female.   Patient presents today with a 4-day history of URI symptoms including cough, wheezing, shortness of breath.  She reports that her symptoms are worse at night.  Denies any additional symptoms including fever, chest pain, nausea, vomiting, congestion, sore throat.  Denies any known sick contacts.  She has had COVID several years ago when the pandemic first began.  She has not had COVID-19 vaccinations.  She reports being told that she has mild asthma but has not had albuterol inhalers regularly to manage her symptoms.  She has been using her mother's breathing medications with temporary improvement of symptoms.  She is also tried over-the-counter cold and flu medication with minimal improvement.  Denies any recent antibiotics or steroids.  She does smoke.  Denies history of seasonal allergies or COPD.  She is confident that she is not pregnant.    Past Medical History:  Diagnosis Date   Abnormal Pap smear    Anemia    Benign neoplasm of pituitary gland (HCC)    Bipolar 1 disorder, depressed (HCC)    biopolar   Depression    Galactorrhea    Migraine    without aura   Nausea    Ulcer     Patient Active Problem List   Diagnosis Date Noted   History of cervical dysplasia 11/08/2012    Past Surgical History:  Procedure Laterality Date   APPENDECTOMY     CARPECTOMY WITH RADIAL STYLOIDECTOMY Right 03/02/2018   Procedure: PROXIMAL ROW CARPECTOMY RIGHT WRIST WITH POSSIBLE RADIAL STYLOIDECTOMY, POSTERIOR INTEROSSEOUS NERVE RESECTION;  Surgeon: Cindee Salt, MD;  Location: [facility];  Service: Orthopedics;  Laterality: Right;  AXILLARY BLOCK   LEEP     leep   TUBAL LIGATION      OB History   No obstetric history on file.      Home Medications    Prior to  Admission medications   Medication Sig Start Date End Date Taking? Authorizing Provider  budesonide-formoterol (SYMBICORT) 80-4.5 MCG/ACT inhaler Inhale 2 puffs into the lungs 2 (two) times daily as needed. 09/14/22  Yes Kindle Strohmeier K, PA-C  predniSONE (DELTASONE) 20 MG tablet Take 2 tablets (40 mg total) by mouth daily for 4 days. 09/14/22 09/18/22 Yes Raynee Mccasland, Noberto Retort, PA-C    Family History Family History  Problem Relation Age of Onset   Hypertension Mother    Ulcers Mother        gastric   Diabetes Maternal Grandmother    Cancer Maternal Grandfather     Social History Social History   Tobacco Use   Smoking status: Every Day    Packs/day: 1    Types: Cigarettes   Smokeless tobacco: Never   Tobacco comments:    1 pkg a day.  Vaping Use   Vaping Use: Some days   Substances: Nicotine  Substance Use Topics   Alcohol use: Not Currently   Drug use: Yes    Types: Marijuana     Allergies   Patient has no known allergies.   Review of Systems Review of Systems  Constitutional:  Positive for activity change. Negative for appetite change, fatigue and fever.  HENT:  Negative for congestion, sinus pressure, sneezing and sore throat.   Respiratory:  Positive for cough, chest tightness and shortness of breath. Negative for wheezing.   Cardiovascular:  Negative for chest pain.  Gastrointestinal:  Negative for abdominal pain, diarrhea, nausea and vomiting.  Neurological:  Negative for dizziness, light-headedness and headaches.     Physical Exam Triage Vital Signs ED Triage Vitals  Enc Vitals Group     BP 09/14/22 1700 110/75     Pulse Rate 09/14/22 1700 (!) 112     Resp 09/14/22 1700 16     Temp 09/14/22 1700 98.1 F (36.7 C)     Temp Source 09/14/22 1700 Oral     SpO2 09/14/22 1700 94 %     Weight --      Height --      Head Circumference --      Peak Flow --      Pain Score 09/14/22 1701 0     Pain Loc --      Pain Edu? --      Excl. in GC? --    No data  found.  Updated Vital Signs BP 110/75 (BP Location: Right Arm)   Pulse (!) 104   Temp 98.1 F (36.7 C) (Oral)   Resp 16   LMP 09/07/2022   SpO2 98%   Visual Acuity Right Eye Distance:   Left Eye Distance:   Bilateral Distance:    Right Eye Near:   Left Eye Near:    Bilateral Near:     Physical Exam Vitals reviewed.  Constitutional:      General: She is awake. She is not in acute distress.    Appearance: Normal appearance. She is well-developed. She is not ill-appearing.     Comments: Very pleasant female appears stated age in no acute distress sitting comfortably in exam room  HENT:     Head: Normocephalic and atraumatic.     Right Ear: Tympanic membrane, ear canal and external ear normal. Tympanic membrane is not erythematous or bulging.     Left Ear: Tympanic membrane, ear canal and external ear normal. Tympanic membrane is not erythematous or bulging.     Nose:     Right Sinus: No maxillary sinus tenderness or frontal sinus tenderness.     Left Sinus: No maxillary sinus tenderness or frontal sinus tenderness.     Mouth/Throat:     Pharynx: Uvula midline. No oropharyngeal exudate or posterior oropharyngeal erythema.  Cardiovascular:     Rate and Rhythm: Normal rate and regular rhythm.     Heart sounds: Normal heart sounds, S1 normal and S2 normal. No murmur heard. Pulmonary:     Effort: Pulmonary effort is normal.     Breath sounds: Examination of the right-lower field reveals decreased breath sounds. Decreased breath sounds present. No wheezing, rhonchi or rales.  Musculoskeletal:     Right lower leg: No edema.     Left lower leg: No edema.  Psychiatric:        Behavior: Behavior is cooperative.      UC Treatments / Results  Labs (all labs ordered are listed, but only abnormal results are displayed) Labs Reviewed - No data to display  EKG   Radiology DG Chest 2 View  Result Date: 09/14/2022 CLINICAL DATA:  rales on right EXAM: CHEST - 2 VIEW COMPARISON:   None Available. FINDINGS: The heart and mediastinal contours are within normal limits. No focal consolidation. No pulmonary edema. No pleural effusion. No pneumothorax. No acute osseous abnormality. IMPRESSION: No active cardiopulmonary disease. Electronically Signed  By: Tish Frederickson M.D.   On: 09/14/2022 17:52    Procedures Procedures (including critical care time)  Medications Ordered in UC Medications  albuterol (VENTOLIN HFA) 108 (90 Base) MCG/ACT inhaler 2 puff (2 puffs Inhalation Given 09/14/22 1723)  AeroChamber Plus Flo-Vu Large MISC 1 each (1 each Other Given 09/14/22 1724)    Initial Impression / Assessment and Plan / UC Course  I have reviewed the triage vital signs and the nursing notes.  Pertinent labs & imaging results that were available during my care of the patient were reviewed by me and considered in my medical decision making (see chart for details).     Patient was mildly tachycardic but otherwise well-appearing and afebrile.  Her tachycardia did improve following a dose of albuterol.  X-ray was obtained that showed no acute cardiopulmonary disease.  Discussed that symptoms are likely related to asthma exacerbation that could be triggered by a viral illness or allergies.  She was sent home with albuterol with instruction to use this every 4-6 hours as needed.  Will start Symbicort and we discussed that if she shortness of breath following use of this medication to prevent thrush.  Will defer antibiotics for the time being.  Will start prednisone burst of 40 mg for 4 days.  Discussed that she is not to take NSAIDs with this medication.  She is to use over-the-counter medication including Mucinex and Flonase.  Discussed that she should follow-up with her primary care.  We did discuss that her heart rate is slightly elevated and it be worthwhile to do more investigation of this but she was anxious to leave as she had to pick up her daughter within the next hour and so declined  workup.  Recommended that she follow-up with her PCP for reevaluation.  If she has any worsening or changing symptoms including increasing shortness of breath, worsening cough, chest tightness, palpitations, weakness, lightheadedness, chest pain she is to go to the emergency room to which she expressed understanding.  All questions answered to patient satisfaction.  Final Clinical Impressions(s) / UC Diagnoses   Final diagnoses:  Mild intermittent asthma with acute exacerbation     Discharge Instructions      Your x-ray was normal with no evidence of pneumonia.  I suspect that you have an asthma exacerbation versus bronchitis.  Use your albuterol inhaler every 4-6 hours as needed.  Start Symbicort twice daily.  Rinse her mouth following use of this medication to prevent thrush.  Start prednisone 40 mg for 4 days.  Do not take NSAIDs with this medication including aspirin, ibuprofen/Advil, naproxen/Aleve.  Make sure that you are resting and drinking plenty of fluid.  Your heart rate is a little bit elevated which I believe is related to your sickness.  Please follow-up with your primary care later this week for recheck including recheck of your heart rate.  If you develop any chest pain, lightheadedness, heart racing you need to go to the emergency room.     ED Prescriptions     Medication Sig Dispense Auth. Provider   budesonide-formoterol (SYMBICORT) 80-4.5 MCG/ACT inhaler Inhale 2 puffs into the lungs 2 (two) times daily as needed. 1 each Ailine Hefferan K, PA-C   predniSONE (DELTASONE) 20 MG tablet Take 2 tablets (40 mg total) by mouth daily for 4 days. 8 tablet Seichi Kaufhold, Noberto Retort, PA-C      PDMP not reviewed this encounter.   Jeani Hawking, PA-C 09/14/22 1807

## 2022-09-14 NOTE — ED Triage Notes (Addendum)
Patient c/o a productive cough with yellow sputum x 3 days and worse at night.  Patient states she has been taking OTC "cough and cold " medication.

## 2022-09-14 NOTE — Discharge Instructions (Addendum)
Your x-ray was normal with no evidence of pneumonia.  I suspect that you have an asthma exacerbation versus bronchitis.  Use your albuterol inhaler every 4-6 hours as needed.  Start Symbicort twice daily.  Rinse her mouth following use of this medication to prevent thrush.  Start prednisone 40 mg for 4 days.  Do not take NSAIDs with this medication including aspirin, ibuprofen/Advil, naproxen/Aleve.  Make sure that you are resting and drinking plenty of fluid.  Your heart rate is a little bit elevated which I believe is related to your sickness.  Please follow-up with your primary care later this week for recheck including recheck of your heart rate.  If you develop any chest pain, lightheadedness, heart racing you need to go to the emergency room.

## 2022-09-15 ENCOUNTER — Other Ambulatory Visit (HOSPITAL_COMMUNITY): Payer: Self-pay

## 2022-09-15 ENCOUNTER — Telehealth (HOSPITAL_COMMUNITY): Payer: Self-pay

## 2022-09-15 MED ORDER — PREDNISONE 20 MG PO TABS: 40.0000 mg | ORAL_TABLET | Freq: Every day | ORAL | 0 refills | Status: AC

## 2022-09-15 MED ORDER — BUDESONIDE-FORMOTEROL FUMARATE 80-4.5 MCG/ACT IN AERO: 2.0000 | INHALATION_SPRAY | Freq: Two times a day (BID) | RESPIRATORY_TRACT | 0 refills | Status: AC | PRN

## 2022-09-16 ENCOUNTER — Other Ambulatory Visit (HOSPITAL_COMMUNITY): Payer: Self-pay

## 2022-09-22 IMAGING — US US TRANSVAGINAL NON-OB
1 series · 14 of 25 positions shown · non-contrast
Comparison: 02/29/2020

CLINICAL DATA: Pelvic pain, negative urine test but patient
indicates multiple positive home pregnancy tests; LMP 09/14/2020

EXAM:
ULTRASOUND PELVIS TRANSVAGINAL
TECHNIQUE: Transvaginal ultrasound examination of the pelvis was performed
including evaluation of the uterus, ovaries, adnexal regions, and
pelvic cul-de-sac.

[Series 1: us transvaginal non-ob · 14 of 50 slices shown]
[im 1/50]
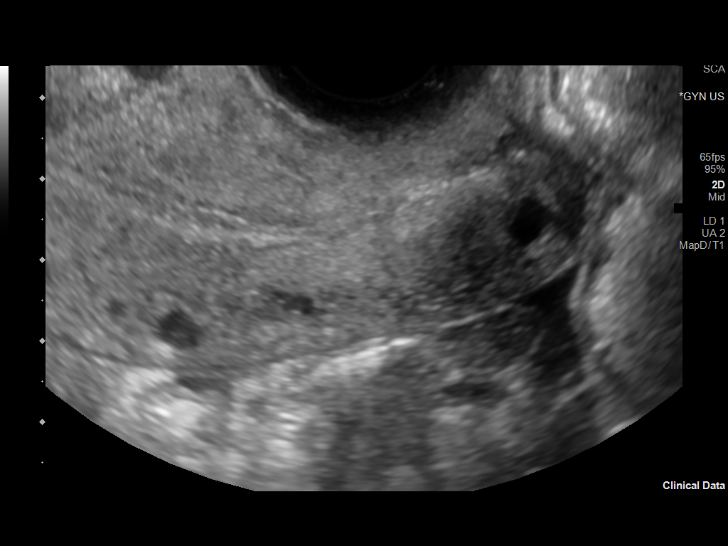
[im 5/50]
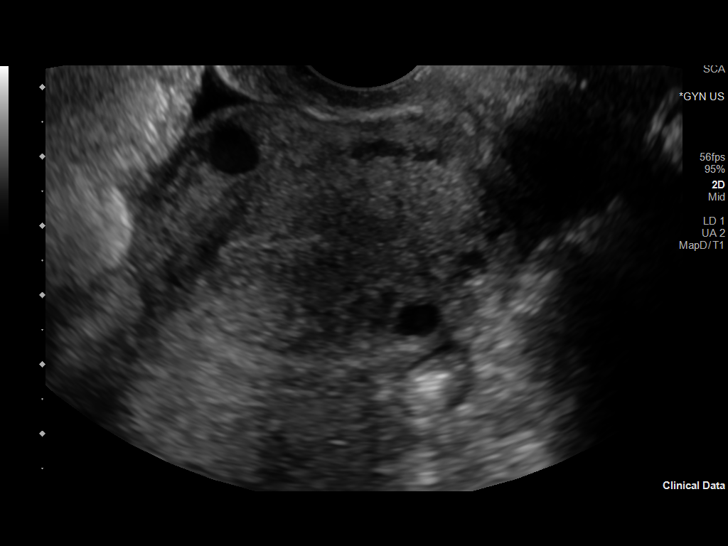
[im 9/50]
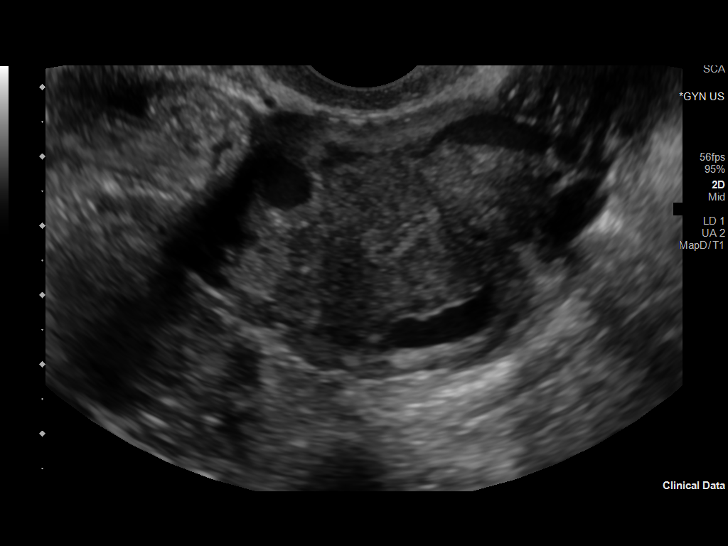
[im 13/50]
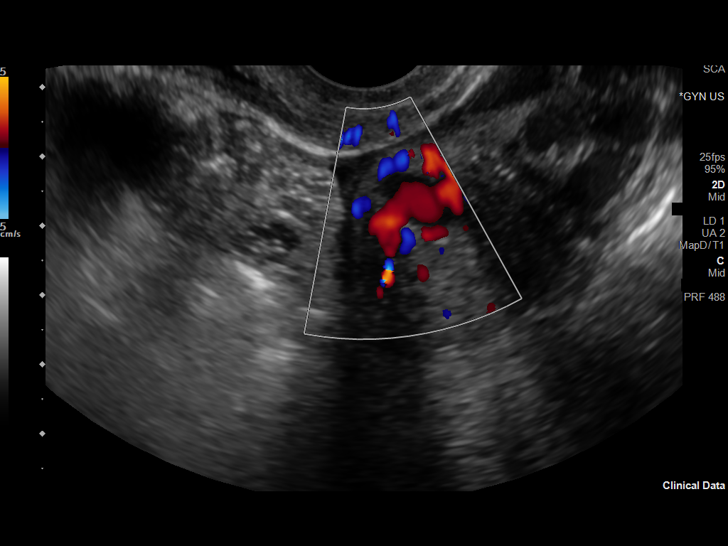
[im 17/50]
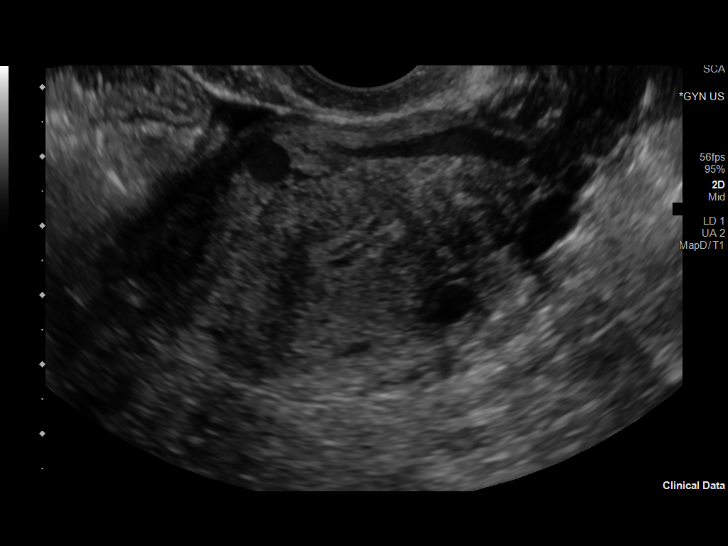
[im 19/50]
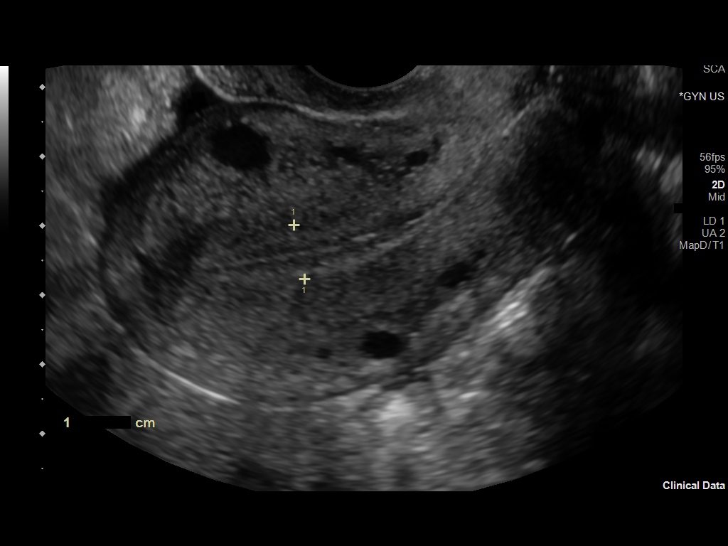
[im 23/50]
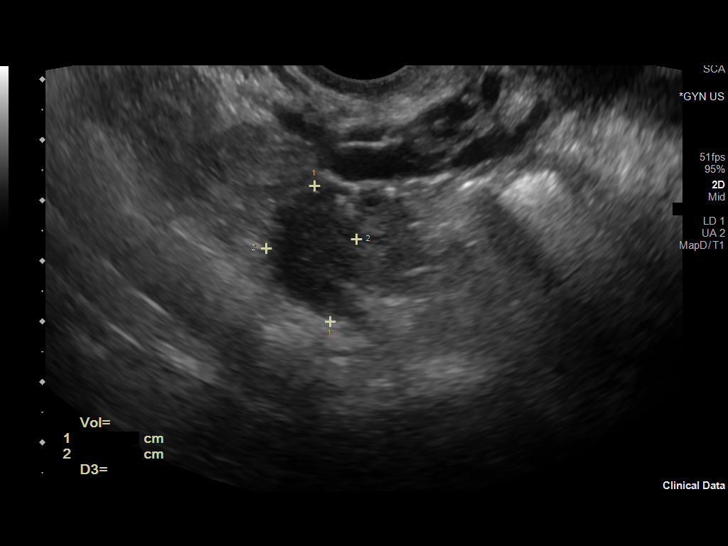
[im 27/50]
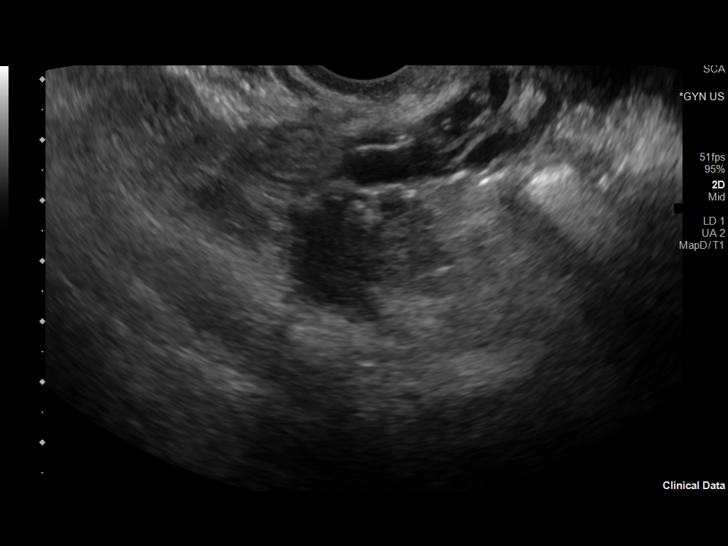
[im 31/50]
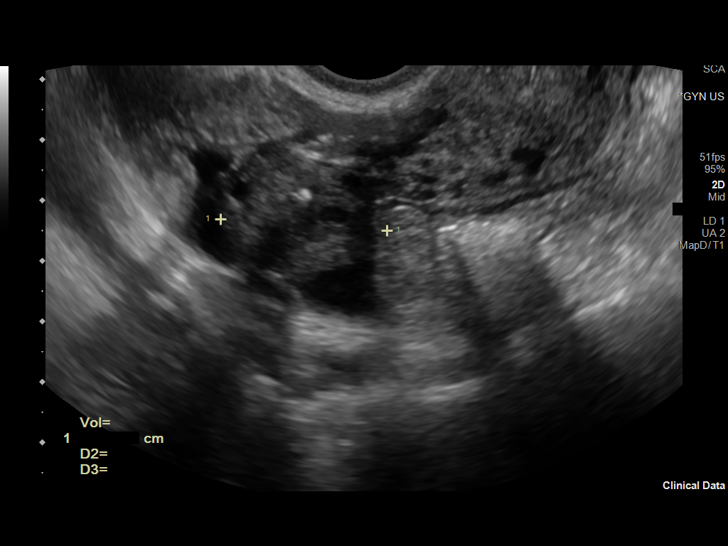
[im 33/50]
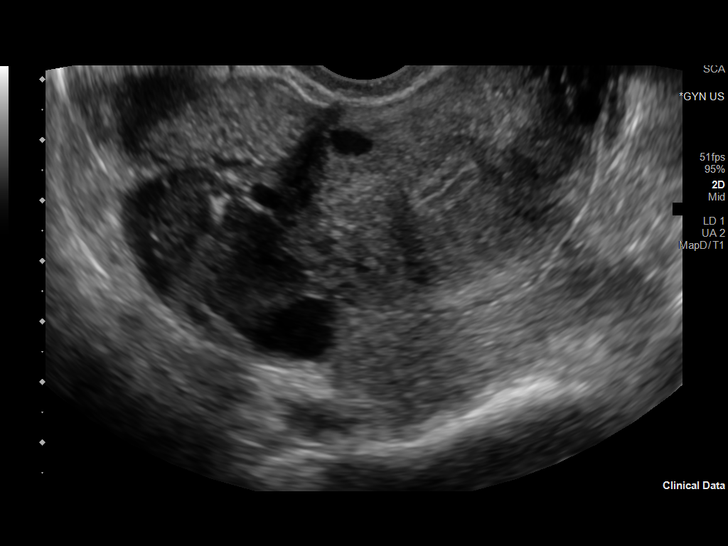
[im 37/50]
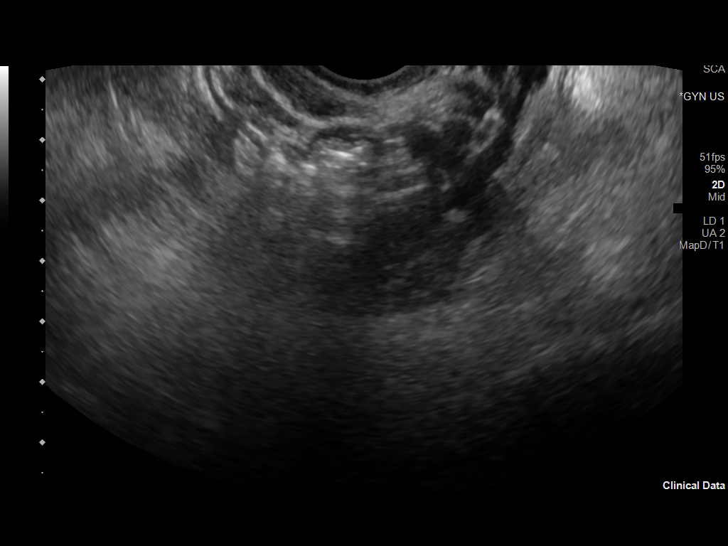
[im 41/50]
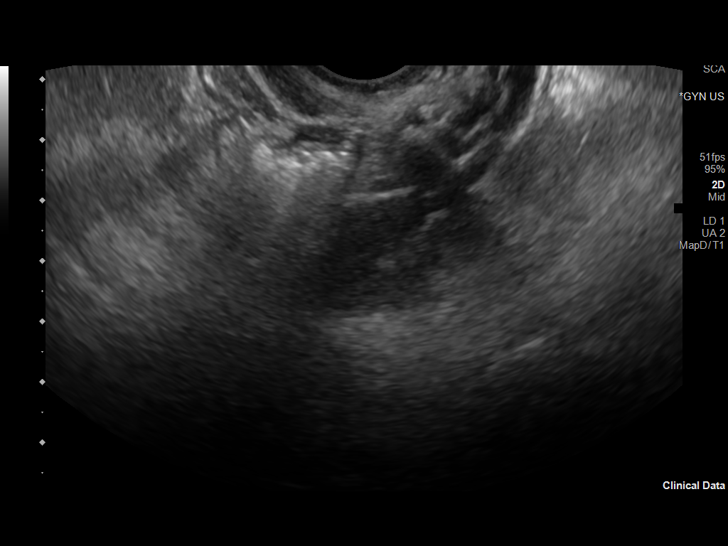
[im 45/50]
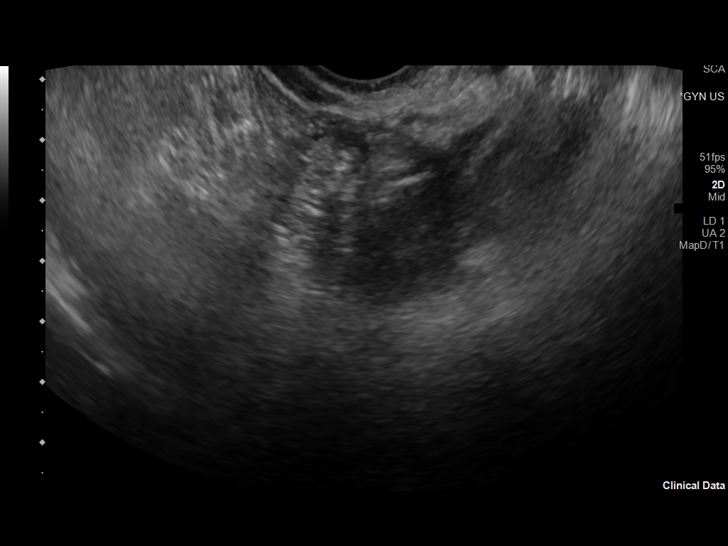
[im 50/50]
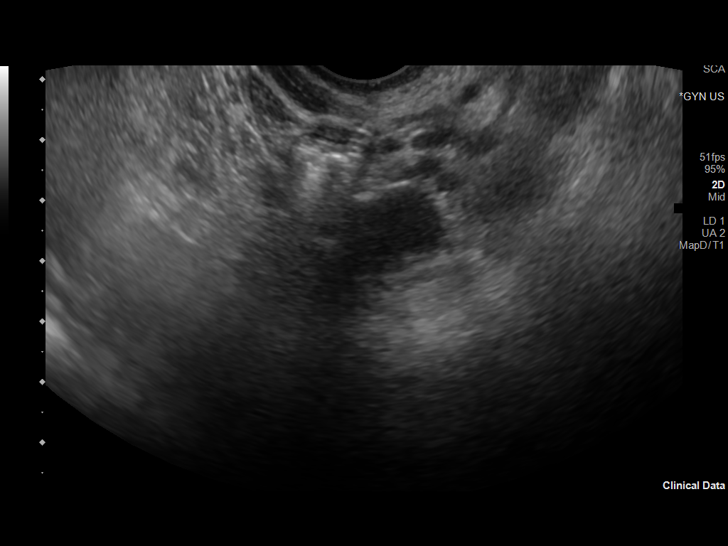

[14 of 25 positions shown; findings below may reference images not displayed]

FINDINGS: Uterus

Measurements: 7.5 x 4.1 x 5.5 cm = volume: 87.7 mL. Anteverted.
Slightly heterogeneous myometrium. No focal mass.

Endometrium

Thickness: 8 mm. No endometrial fluid or mass. No gestational sac
identified.

Right ovary

Measurements: 3.7 x 2.6 x 2.8 cm = volume: 13.7 mL. Normal
morphology without mass

Left ovary

Measurements: 3.2 x 2.0 x 1.8 cm = volume: 5.9 mL. Normal morphology
without mass

Other findings:  No free pelvic fluid.  No adnexal masses.
IMPRESSION: No pelvic sonographic abnormalities.

## 2022-09-25 DIAGNOSIS — F319 Bipolar disorder, unspecified: Secondary | ICD-10-CM | POA: Diagnosis not present

## 2022-09-25 DIAGNOSIS — F1420 Cocaine dependence, uncomplicated: Secondary | ICD-10-CM | POA: Diagnosis not present

## 2022-09-25 DIAGNOSIS — F17200 Nicotine dependence, unspecified, uncomplicated: Secondary | ICD-10-CM | POA: Diagnosis not present

## 2022-09-25 DIAGNOSIS — F419 Anxiety disorder, unspecified: Secondary | ICD-10-CM | POA: Diagnosis not present

## 2022-09-25 DIAGNOSIS — F329 Major depressive disorder, single episode, unspecified: Secondary | ICD-10-CM | POA: Diagnosis not present

## 2022-09-25 DIAGNOSIS — F1220 Cannabis dependence, uncomplicated: Secondary | ICD-10-CM | POA: Diagnosis not present

## 2022-09-25 DIAGNOSIS — F1494 Cocaine use, unspecified with cocaine-induced mood disorder: Secondary | ICD-10-CM | POA: Diagnosis not present

## 2022-09-25 DIAGNOSIS — F14980 Cocaine use, unspecified with cocaine-induced anxiety disorder: Secondary | ICD-10-CM | POA: Diagnosis not present

## 2022-09-27 DIAGNOSIS — F1420 Cocaine dependence, uncomplicated: Secondary | ICD-10-CM | POA: Diagnosis not present

## 2022-09-27 DIAGNOSIS — F319 Bipolar disorder, unspecified: Secondary | ICD-10-CM | POA: Diagnosis not present

## 2022-09-27 DIAGNOSIS — F1220 Cannabis dependence, uncomplicated: Secondary | ICD-10-CM | POA: Diagnosis not present

## 2022-09-27 DIAGNOSIS — F14980 Cocaine use, unspecified with cocaine-induced anxiety disorder: Secondary | ICD-10-CM | POA: Diagnosis not present

## 2022-09-27 DIAGNOSIS — F1494 Cocaine use, unspecified with cocaine-induced mood disorder: Secondary | ICD-10-CM | POA: Diagnosis not present

## 2022-09-27 DIAGNOSIS — F419 Anxiety disorder, unspecified: Secondary | ICD-10-CM | POA: Diagnosis not present

## 2022-09-27 DIAGNOSIS — F17200 Nicotine dependence, unspecified, uncomplicated: Secondary | ICD-10-CM | POA: Diagnosis not present

## 2022-09-27 DIAGNOSIS — F329 Major depressive disorder, single episode, unspecified: Secondary | ICD-10-CM | POA: Diagnosis not present

## 2022-09-28 DIAGNOSIS — F419 Anxiety disorder, unspecified: Secondary | ICD-10-CM | POA: Diagnosis not present

## 2022-09-28 DIAGNOSIS — F1220 Cannabis dependence, uncomplicated: Secondary | ICD-10-CM | POA: Diagnosis not present

## 2022-09-28 DIAGNOSIS — F1420 Cocaine dependence, uncomplicated: Secondary | ICD-10-CM | POA: Diagnosis not present

## 2022-09-28 DIAGNOSIS — F319 Bipolar disorder, unspecified: Secondary | ICD-10-CM | POA: Diagnosis not present

## 2022-09-28 DIAGNOSIS — F17200 Nicotine dependence, unspecified, uncomplicated: Secondary | ICD-10-CM | POA: Diagnosis not present

## 2022-09-28 DIAGNOSIS — F329 Major depressive disorder, single episode, unspecified: Secondary | ICD-10-CM | POA: Diagnosis not present

## 2022-09-28 DIAGNOSIS — F14980 Cocaine use, unspecified with cocaine-induced anxiety disorder: Secondary | ICD-10-CM | POA: Diagnosis not present

## 2022-09-28 DIAGNOSIS — F1494 Cocaine use, unspecified with cocaine-induced mood disorder: Secondary | ICD-10-CM | POA: Diagnosis not present

## 2022-09-29 DIAGNOSIS — F1494 Cocaine use, unspecified with cocaine-induced mood disorder: Secondary | ICD-10-CM | POA: Diagnosis not present

## 2022-09-29 DIAGNOSIS — F329 Major depressive disorder, single episode, unspecified: Secondary | ICD-10-CM | POA: Diagnosis not present

## 2022-09-29 DIAGNOSIS — F1420 Cocaine dependence, uncomplicated: Secondary | ICD-10-CM | POA: Diagnosis not present

## 2022-09-29 DIAGNOSIS — F319 Bipolar disorder, unspecified: Secondary | ICD-10-CM | POA: Diagnosis not present

## 2022-09-29 DIAGNOSIS — F1220 Cannabis dependence, uncomplicated: Secondary | ICD-10-CM | POA: Diagnosis not present

## 2022-09-29 DIAGNOSIS — F419 Anxiety disorder, unspecified: Secondary | ICD-10-CM | POA: Diagnosis not present

## 2022-09-29 DIAGNOSIS — F14980 Cocaine use, unspecified with cocaine-induced anxiety disorder: Secondary | ICD-10-CM | POA: Diagnosis not present

## 2022-09-29 DIAGNOSIS — F17200 Nicotine dependence, unspecified, uncomplicated: Secondary | ICD-10-CM | POA: Diagnosis not present

## 2022-09-30 DIAGNOSIS — F14980 Cocaine use, unspecified with cocaine-induced anxiety disorder: Secondary | ICD-10-CM | POA: Diagnosis not present

## 2022-09-30 DIAGNOSIS — F329 Major depressive disorder, single episode, unspecified: Secondary | ICD-10-CM | POA: Diagnosis not present

## 2022-09-30 DIAGNOSIS — F1420 Cocaine dependence, uncomplicated: Secondary | ICD-10-CM | POA: Diagnosis not present

## 2022-09-30 DIAGNOSIS — F17200 Nicotine dependence, unspecified, uncomplicated: Secondary | ICD-10-CM | POA: Diagnosis not present

## 2022-09-30 DIAGNOSIS — F419 Anxiety disorder, unspecified: Secondary | ICD-10-CM | POA: Diagnosis not present

## 2022-09-30 DIAGNOSIS — F1220 Cannabis dependence, uncomplicated: Secondary | ICD-10-CM | POA: Diagnosis not present

## 2022-09-30 DIAGNOSIS — F319 Bipolar disorder, unspecified: Secondary | ICD-10-CM | POA: Diagnosis not present

## 2022-09-30 DIAGNOSIS — F1494 Cocaine use, unspecified with cocaine-induced mood disorder: Secondary | ICD-10-CM | POA: Diagnosis not present

## 2022-10-01 DIAGNOSIS — F319 Bipolar disorder, unspecified: Secondary | ICD-10-CM | POA: Diagnosis not present

## 2022-10-01 DIAGNOSIS — F419 Anxiety disorder, unspecified: Secondary | ICD-10-CM | POA: Diagnosis not present

## 2022-10-01 DIAGNOSIS — F17200 Nicotine dependence, unspecified, uncomplicated: Secondary | ICD-10-CM | POA: Diagnosis not present

## 2022-10-01 DIAGNOSIS — F1220 Cannabis dependence, uncomplicated: Secondary | ICD-10-CM | POA: Diagnosis not present

## 2022-10-01 DIAGNOSIS — F14980 Cocaine use, unspecified with cocaine-induced anxiety disorder: Secondary | ICD-10-CM | POA: Diagnosis not present

## 2022-10-01 DIAGNOSIS — F1420 Cocaine dependence, uncomplicated: Secondary | ICD-10-CM | POA: Diagnosis not present

## 2022-10-01 DIAGNOSIS — F329 Major depressive disorder, single episode, unspecified: Secondary | ICD-10-CM | POA: Diagnosis not present

## 2022-10-01 DIAGNOSIS — F1494 Cocaine use, unspecified with cocaine-induced mood disorder: Secondary | ICD-10-CM | POA: Diagnosis not present

## 2022-10-02 DIAGNOSIS — F14980 Cocaine use, unspecified with cocaine-induced anxiety disorder: Secondary | ICD-10-CM | POA: Diagnosis not present

## 2022-10-02 DIAGNOSIS — F1420 Cocaine dependence, uncomplicated: Secondary | ICD-10-CM | POA: Diagnosis not present

## 2022-10-02 DIAGNOSIS — F319 Bipolar disorder, unspecified: Secondary | ICD-10-CM | POA: Diagnosis not present

## 2022-10-02 DIAGNOSIS — F1494 Cocaine use, unspecified with cocaine-induced mood disorder: Secondary | ICD-10-CM | POA: Diagnosis not present

## 2022-10-02 DIAGNOSIS — F17200 Nicotine dependence, unspecified, uncomplicated: Secondary | ICD-10-CM | POA: Diagnosis not present

## 2022-10-02 DIAGNOSIS — F419 Anxiety disorder, unspecified: Secondary | ICD-10-CM | POA: Diagnosis not present

## 2022-10-02 DIAGNOSIS — F1220 Cannabis dependence, uncomplicated: Secondary | ICD-10-CM | POA: Diagnosis not present

## 2022-10-02 DIAGNOSIS — F329 Major depressive disorder, single episode, unspecified: Secondary | ICD-10-CM | POA: Diagnosis not present

## 2022-10-04 ENCOUNTER — Ambulatory Visit (HOSPITAL_COMMUNITY)
Admission: EM | Admit: 2022-10-04 | Discharge: 2022-10-04 | Discharge status: Against Medical Advice | Payer: Medicare HMO | Attending: Internal Medicine | Admitting: Internal Medicine

## 2022-10-04 NOTE — ED Triage Notes (Addendum)
Pt left the property .

## 2022-10-07 ENCOUNTER — Emergency Department (HOSPITAL_COMMUNITY)
Admission: EM | Admit: 2022-10-07 | Discharge: 2022-10-07 | Disposition: A | Discharge status: Home / Self Care | Payer: Medicare HMO | Attending: Emergency Medicine | Admitting: Emergency Medicine

## 2022-10-07 ENCOUNTER — Emergency Department (HOSPITAL_COMMUNITY): Payer: Medicare HMO

## 2022-10-07 DIAGNOSIS — F17200 Nicotine dependence, unspecified, uncomplicated: Secondary | ICD-10-CM | POA: Diagnosis not present

## 2022-10-07 DIAGNOSIS — J40 Bronchitis, not specified as acute or chronic: Secondary | ICD-10-CM | POA: Diagnosis not present

## 2022-10-07 DIAGNOSIS — R0789 Other chest pain: Secondary | ICD-10-CM | POA: Diagnosis not present

## 2022-10-07 DIAGNOSIS — Z1152 Encounter for screening for COVID-19: Secondary | ICD-10-CM | POA: Diagnosis not present

## 2022-10-07 DIAGNOSIS — R0602 Shortness of breath: Secondary | ICD-10-CM | POA: Diagnosis not present

## 2022-10-07 DIAGNOSIS — F1490 Cocaine use, unspecified, uncomplicated: Secondary | ICD-10-CM | POA: Diagnosis not present

## 2022-10-07 DIAGNOSIS — R059 Cough, unspecified: Secondary | ICD-10-CM | POA: Diagnosis not present

## 2022-10-07 LAB — CBC WITH DIFFERENTIAL/PLATELET
Abs Immature Granulocytes: 0.03 10*3/uL (ref 0.00–0.07)
Basophils Absolute: 0 10*3/uL (ref 0.0–0.1)
Basophils Relative: 0 %
Eosinophils Absolute: 0.1 10*3/uL (ref 0.0–0.5)
Eosinophils Relative: 1 %
HCT: 35.9 % — ABNORMAL LOW (ref 36.0–46.0)
Hemoglobin: 12.4 g/dL (ref 12.0–15.0)
Immature Granulocytes: 0 %
Lymphocytes Relative: 15 %
Lymphs Abs: 1.7 10*3/uL (ref 0.7–4.0)
MCH: 30.5 pg (ref 26.0–34.0)
MCHC: 34.5 g/dL (ref 30.0–36.0)
MCV: 88.2 fL (ref 80.0–100.0)
Monocytes Absolute: 0.9 10*3/uL (ref 0.1–1.0)
Monocytes Relative: 8 %
Neutro Abs: 8.9 10*3/uL — ABNORMAL HIGH (ref 1.7–7.7)
Neutrophils Relative %: 76 %
Platelets: 272 10*3/uL (ref 150–400)
RBC: 4.07 MIL/uL (ref 3.87–5.11)
RDW: 14.8 % (ref 11.5–15.5)
WBC: 11.5 10*3/uL — ABNORMAL HIGH (ref 4.0–10.5)
nRBC: 0 % (ref 0.0–0.2)

## 2022-10-07 LAB — COMPREHENSIVE METABOLIC PANEL
ALT: 18 U/L (ref 0–44)
AST: 21 U/L (ref 15–41)
Albumin: 3.5 g/dL (ref 3.5–5.0)
Alkaline Phosphatase: 79 U/L (ref 38–126)
Anion gap: 12 (ref 5–15)
BUN: 12 mg/dL (ref 6–20)
CO2: 24 mmol/L (ref 22–32)
Calcium: 8.6 mg/dL — ABNORMAL LOW (ref 8.9–10.3)
Chloride: 101 mmol/L (ref 98–111)
Creatinine, Ser: 0.69 mg/dL (ref 0.44–1.00)
GFR, Estimated: >60 mL/min (ref >60)
Glucose, Bld: 106 mg/dL — ABNORMAL HIGH (ref 70–99)
Potassium: 3 mmol/L — ABNORMAL LOW (ref 3.5–5.1)
Sodium: 137 mmol/L (ref 135–145)
Total Bilirubin: 0.4 mg/dL (ref 0.3–1.2)
Total Protein: 6.5 g/dL (ref 6.5–8.1)

## 2022-10-07 LAB — D-DIMER, QUANTITATIVE: D-Dimer, Quant: 0.27 ug/mL-FEU (ref 0.00–0.50)

## 2022-10-07 LAB — RESP PANEL BY RT-PCR (RSV, FLU A&B, COVID)  RVPGX2
Influenza A by PCR: NEGATIVE
Influenza B by PCR: NEGATIVE
Resp Syncytial Virus by PCR: NEGATIVE
SARS Coronavirus 2 by RT PCR: NEGATIVE

## 2022-10-07 LAB — TROPONIN I (HIGH SENSITIVITY): Troponin I (High Sensitivity): 4 ng/L (ref <18)

## 2022-10-07 LAB — LIPASE, BLOOD: Lipase: 25 U/L (ref 11–51)

## 2022-10-07 LAB — ETHANOL: Alcohol, Ethyl (B): <10 mg/dL (ref <10)

## 2022-10-07 MED ORDER — BENZONATATE 100 MG PO CAPS
100.0000 mg | ORAL_CAPSULE | Freq: Once | ORAL | Status: AC
  Administered 2022-10-07: 100 mg via ORAL
  Filled 2022-10-07: qty 1

## 2022-10-07 MED ORDER — IPRATROPIUM-ALBUTEROL 0.5-2.5 (3) MG/3ML IN SOLN
3.0000 mL | Freq: Once | RESPIRATORY_TRACT | Status: AC
  Administered 2022-10-07: 3 mL via RESPIRATORY_TRACT
  Filled 2022-10-07: qty 3

## 2022-10-07 MED ORDER — ALBUTEROL SULFATE HFA 108 (90 BASE) MCG/ACT IN AERS
2.0000 | INHALATION_SPRAY | Freq: Four times a day (QID) | RESPIRATORY_TRACT | Status: DC
  Filled 2022-10-07: qty 6.7

## 2022-10-07 MED ORDER — PREDNISONE 20 MG PO TABS: 40.0000 mg | ORAL_TABLET | Freq: Every day | ORAL | 0 refills | Status: AC

## 2022-10-07 MED ORDER — ALBUTEROL SULFATE HFA 108 (90 BASE) MCG/ACT IN AERS: 2.0000 | INHALATION_SPRAY | RESPIRATORY_TRACT | Status: DC | PRN

## 2022-10-07 MED ORDER — BENZONATATE 100 MG PO CAPS: 100.0000 mg | ORAL_CAPSULE | Freq: Three times a day (TID) | ORAL | 0 refills | Status: DC

## 2022-10-07 NOTE — Discharge Instructions (Addendum)
Please use the provided albuterol inhaler every 4 hours for the next 2 days and then as needed.  In addition, obtain and take the prescribed steroids and cough suppressant.  Follow-up with your physician.

## 2022-10-07 NOTE — ED Provider Notes (Signed)
[place] EMERGENCY DEPARTMENT AT [facility] Provider Note   CSN: [id number] Arrival date & time: 10/07/22  4098     History  Chief Complaint  Patient presents with   Cough   Shortness of Breath   Chest Pain    Kathryn Mcneil is a 42 y.o. female.  HPI Patient presents 3 days after leaving rehabilitation in [place], now with concern for cough, chest pain.  She notes that she uses crack cocaine, smokes, no IV drug use. She may have had mild cough while there, but notes that since discharge, she has had worsening cough, chest pain with coughing, and at rest.  She has had relapse, has been using crack since discharge, yesterday most recently. No fever, no vomiting.     Home Medications Prior to Admission medications   Medication Sig Start Date End Date Taking? Authorizing Provider  benzonatate (TESSALON) 100 MG capsule Take 1 capsule (100 mg total) by mouth every 8 (eight) hours. 10/07/22  Yes Gerhard Munch, MD  predniSONE (DELTASONE) 20 MG tablet Take 2 tablets (40 mg total) by mouth daily with breakfast. For the next four days 10/07/22  Yes Gerhard Munch, MD  budesonide-formoterol [facility]) 80-4.5 MCG/ACT inhaler Inhale 2 puffs into the lungs 2 (two) times daily as needed. 09/15/22   Lamptey, Britta Mccreedy, MD      Allergies    Patient has no known allergies.    Review of Systems   Review of Systems  All other systems reviewed and are negative.   Physical Exam Updated Vital Signs BP 107/72   Pulse 94   Temp 98.6 F (37 C) (Oral)   Resp 17   LMP 09/07/2022   SpO2 100%  Physical Exam Vitals and nursing note reviewed.  Constitutional:      General: She is not in acute distress.    Appearance: She is well-developed.  HENT:     Head: Normocephalic and atraumatic.  Eyes:     Conjunctiva/sclera: Conjunctivae normal.  Cardiovascular:     Rate and Rhythm: Normal rate and regular rhythm.  Pulmonary:     Effort: Pulmonary effort is normal. Tachypnea  present.     Breath sounds: Decreased breath sounds present.  Abdominal:     General: There is no distension.  Skin:    General: Skin is warm and dry.  Neurological:     Mental Status: She is alert and oriented to person, place, and time.     Cranial Nerves: No cranial nerve deficit.  Psychiatric:        Mood and Affect: Mood normal.     ED Results / Procedures / Treatments   Labs (all labs ordered are listed, but only abnormal results are displayed) Labs Reviewed  COMPREHENSIVE METABOLIC PANEL - Abnormal; Notable for the following components:      Result Value   Potassium 3.0 (*)    Glucose, Bld 106 (*)    Calcium 8.6 (*)    All other components within normal limits  CBC WITH DIFFERENTIAL/PLATELET - Abnormal; Notable for the following components:   WBC 11.5 (*)    HCT 35.9 (*)    Neutro Abs 8.9 (*)    All other components within normal limits  RESP PANEL BY RT-PCR (RSV, FLU A&B, COVID)  RVPGX2  ETHANOL  LIPASE, BLOOD  D-DIMER, QUANTITATIVE  TROPONIN I (HIGH SENSITIVITY)  TROPONIN I (HIGH SENSITIVITY)    EKG EKG Interpretation  Date/Time:  Thursday Oct 07 2022 08:01:01 EDT Ventricular Rate:  105  PR Interval:    QRS Duration: 88 QT Interval:  342 QTC Calculation: 452 R Axis:   70 Text Interpretation: Sinus rhythm Artifact Abnormal ECG Confirmed by Gerhard Munch [phone]) on 10/07/2022 8:05:23 AM  Radiology DG Chest 2 View  Result Date: 10/07/2022 CLINICAL DATA:  Shortness of breath.  Cough.  Bronchitis. EXAM: CHEST - 2 VIEW COMPARISON:  09/14/2022 FINDINGS: Heart size is normal. Mediastinal shadows are normal. The lungs are clear. No infiltrate, collapse or effusion. Question mild central bronchial thickening. No abnormal bone finding. IMPRESSION: No consolidation or collapse. Question mild central bronchial thickening. Electronically Signed   By: Paulina Fusi M.D.   On: 10/07/2022 08:40    Procedures Procedures    Medications Ordered in ED Medications   albuterol (VENTOLIN HFA) 108 (90 Base) MCG/ACT inhaler 2 puff (has no administration in time range)  albuterol (VENTOLIN HFA) 108 (90 Base) MCG/ACT inhaler 2 puff (has no administration in time range)  ipratropium-albuterol (DUONEB) 0.5-2.5 (3) MG/3ML nebulizer solution 3 mL (3 mLs Nebulization Given 10/07/22 0802)  benzonatate (TESSALON) capsule 100 mg (100 mg Oral Given 10/07/22 9811)    ED Course/ Medical Decision Making/ A&P                             Medical Decision Making Adult female with substance abuse history presents with cough, chest pain.  With concern for substance abuse, pneumonia, bronchitis, spontaneous pneumothorax, PE all considerations. On cardiac monitor the patient has sinus tachycardia, rate 105 abnormal Pulse ox 99% room air normal Patient is tachypneic.   Amount and/or Complexity of Data Reviewed Labs: ordered. Decision-making details documented in ED Course. Radiology: ordered and independent interpretation performed. Decision-making details documented in ED Course. ECG/medicine tests: ordered and independent interpretation performed. Decision-making details documented in ED Course.  Risk Prescription drug management. Decision regarding hospitalization.   12:11 PM On repeat exam patient is awake, alert, in no distress, no hypoxia.  I reviewed her x-rays, labs, D-dimer negative, no evidence for ACS, PE, pneumonia, bacteremia, sepsis.  Suspicion for bacteremia. Patient has improved here was discharged with albuterol, steroids, follow-up with primary care.        Final Clinical Impression(s) / ED Diagnoses Final diagnoses:  Bronchitis    Rx / DC Orders ED Discharge Orders          Ordered    predniSONE (DELTASONE) 20 MG tablet  Daily with breakfast        10/07/22 1210    benzonatate (TESSALON) 100 MG capsule  Every 8 hours        10/07/22 1210              Gerhard Munch, MD 10/07/22 1211

## 2022-10-07 NOTE — ED Triage Notes (Signed)
Pt. Has continuous cough due to having bronchitis and cough. Pt stated, I woke up this morning and couldn't stop coughing.

## 2022-10-11 ENCOUNTER — Other Ambulatory Visit: Payer: Self-pay

## 2022-10-11 ENCOUNTER — Emergency Department (HOSPITAL_COMMUNITY)
Admission: EM | Admit: 2022-10-11 | Discharge: 2022-10-11 | Disposition: A | Discharge status: Home / Self Care | Payer: Medicare HMO | Attending: Emergency Medicine | Admitting: Emergency Medicine

## 2022-10-11 ENCOUNTER — Encounter (HOSPITAL_COMMUNITY): Payer: Self-pay

## 2022-10-11 ENCOUNTER — Emergency Department (HOSPITAL_COMMUNITY): Payer: Medicare HMO

## 2022-10-11 DIAGNOSIS — R220 Localized swelling, mass and lump, head: Secondary | ICD-10-CM | POA: Diagnosis not present

## 2022-10-11 DIAGNOSIS — R052 Subacute cough: Secondary | ICD-10-CM | POA: Diagnosis not present

## 2022-10-11 DIAGNOSIS — K1370 Unspecified lesions of oral mucosa: Secondary | ICD-10-CM | POA: Insufficient documentation

## 2022-10-11 DIAGNOSIS — Z8639 Personal history of other endocrine, nutritional and metabolic disease: Secondary | ICD-10-CM | POA: Diagnosis not present

## 2022-10-11 DIAGNOSIS — R059 Cough, unspecified: Secondary | ICD-10-CM | POA: Diagnosis not present

## 2022-10-11 LAB — CBC WITH DIFFERENTIAL/PLATELET
Abs Immature Granulocytes: 0.03 10*3/uL (ref 0.00–0.07)
Basophils Absolute: 0 10*3/uL (ref 0.0–0.1)
Basophils Relative: 0 %
Eosinophils Absolute: 0.1 10*3/uL (ref 0.0–0.5)
Eosinophils Relative: 2 %
HCT: 38.1 % (ref 36.0–46.0)
Hemoglobin: 12.9 g/dL (ref 12.0–15.0)
Immature Granulocytes: 0 %
Lymphocytes Relative: 20 %
Lymphs Abs: 1.3 10*3/uL (ref 0.7–4.0)
MCH: 31.2 pg (ref 26.0–34.0)
MCHC: 33.9 g/dL (ref 30.0–36.0)
MCV: 92 fL (ref 80.0–100.0)
Monocytes Absolute: 0.4 10*3/uL (ref 0.1–1.0)
Monocytes Relative: 6 %
Neutro Abs: 4.8 10*3/uL (ref 1.7–7.7)
Neutrophils Relative %: 72 %
Platelets: 352 10*3/uL (ref 150–400)
RBC: 4.14 MIL/uL (ref 3.87–5.11)
RDW: 14.7 % (ref 11.5–15.5)
WBC: 6.7 10*3/uL (ref 4.0–10.5)
nRBC: 0 % (ref 0.0–0.2)

## 2022-10-11 LAB — BASIC METABOLIC PANEL
Anion gap: 13 (ref 5–15)
BUN: 12 mg/dL (ref 6–20)
CO2: 23 mmol/L (ref 22–32)
Calcium: 8.2 mg/dL — ABNORMAL LOW (ref 8.9–10.3)
Chloride: 100 mmol/L (ref 98–111)
Creatinine, Ser: 0.79 mg/dL (ref 0.44–1.00)
GFR, Estimated: >60 mL/min (ref >60)
Glucose, Bld: 97 mg/dL (ref 70–99)
Potassium: 3.2 mmol/L — ABNORMAL LOW (ref 3.5–5.1)
Sodium: 136 mmol/L (ref 135–145)

## 2022-10-11 LAB — I-STAT BETA HCG BLOOD, ED (MC, WL, AP ONLY): I-stat hCG, quantitative: <5 m[IU]/mL (ref <5)

## 2022-10-11 MED ORDER — DEXAMETHASONE 4 MG PO TABS
6.0000 mg | ORAL_TABLET | Freq: Once | ORAL | Status: AC
  Administered 2022-10-11: 6 mg via ORAL
  Filled 2022-10-11: qty 2

## 2022-10-11 MED ORDER — BENZONATATE 100 MG PO CAPS: 100.0000 mg | ORAL_CAPSULE | Freq: Three times a day (TID) | ORAL | 0 refills | Status: AC

## 2022-10-11 MED ORDER — FAMOTIDINE 20 MG PO TABS: 20.0000 mg | ORAL_TABLET | Freq: Two times a day (BID) | ORAL | 0 refills | Status: AC

## 2022-10-11 NOTE — ED Triage Notes (Signed)
Complains of right lower tongue swelling.  Complains of tongue pain.  Complains of still cough and sob but has been on antibiotics.

## 2022-10-11 NOTE — Discharge Instructions (Addendum)
Thank you for coming to [facility] Emergency Department. You were seen for cough and mouth swelling. We did an exam, labs, and imaging, and these showed no acute findings. We treated you with a single dose of steroid while you were here for your swelling. Please take ibuprofen 600 mg every 8 hours at home for the inflammation.  Please follow up with your primary care provider within 1 week.  You can establish with a primary care physician by using health connect which you can call, or using [employer].  We have prescribed additional Tessalon Perles for your cough as well as Pepcid 20 mg twice per day for 30 days in case there is reflux contributing to your cough.  Do not hesitate to return to the ED or call 911 if you experience: -Worsening symptoms -Shortness of breath -Difficulty breathing -Lightheadedness, passing out -Fevers/chills -Anything else that concerns you

## 2022-10-11 NOTE — ED Provider Notes (Signed)
[place] EMERGENCY DEPARTMENT AT [facility] Provider Note   CSN: [id number] Arrival date & time: 10/11/22  1805     History  Chief Complaint  Patient presents with   Oral Swelling    Kathryn Mcneil is a 42 y.o. female with h/o bipolar disorder, h/o pituitary adenoma, anxiety who presents with multiple complaints.  Patient presents complaining of right lower tongue swelling and tongue pain. She has had no trauma to her mouth or teeth/tongue. No other oral complaints. No sore throat, throat swelling, difficulty breathing, submandibular swelling or neck swelling.  She has not had any recent dental procedures.  She does not have any tooth pain and does not think she has a dental infection.  No pain with neck flexion or extension.  She does not know what was causing this and wanted to get it checked out.  Patient also complains of persistent cough and mild SOB with exertion. Denies chest pain except with cough. Was seen on 10/07/22 and thought to have asthma exacerbation, DC'd w/ prednisone/albuterol/tessalon. She states the only part of that that has helped is the tessalon. No improvement w/ albuterol inhaler either. No further wheezing that she has noticed. No f/c, hemoptysis, LEE, N/V/D.    HPI     Home Medications Prior to Admission medications   Medication Sig Start Date End Date Taking? Authorizing Provider  famotidine (PEPCID) 20 MG tablet Take 1 tablet (20 mg total) by mouth 2 (two) times daily. 10/11/22  Yes Loetta Rough, MD  benzonatate (TESSALON) 100 MG capsule Take 1 capsule (100 mg total) by mouth every 8 (eight) hours. 10/11/22   Loetta Rough, MD  budesonide-formoterol (SYMBICORT) 80-4.5 MCG/ACT inhaler Inhale 2 puffs into the lungs 2 (two) times daily as needed. 09/15/22   Merrilee Jansky, MD  predniSONE (DELTASONE) 20 MG tablet Take 2 tablets (40 mg total) by mouth daily with breakfast. For the next four days 10/07/22   Gerhard Munch, MD      Allergies     Patient has no known allergies.    Review of Systems   Review of Systems Review of systems Negative for f/c.  A 10 point review of systems was performed and is negative unless otherwise reported in HPI.  Physical Exam Updated Vital Signs BP 118/76 (BP Location: Right Arm)   Pulse 86   Temp 98.9 F (37.2 C)   Resp 19   Ht 5\' 4"  (1.626 m)   Wt 52.2 kg   LMP 09/07/2022   SpO2 97%   BMI 19.74 kg/m  Physical Exam General: Normal appearing female, lying in bed.  HEENT: PERRLA, Sclera anicteric, MMM, trachea midline. NCAT.  Dentition okay.  No notable tongue swelling, lesions, erythema, induration, wounds, or purulent drainage.  No tenderness palpation of the lower teeth, no gum swelling, no evidence of acute dental infection.  Posterior oropharynx normal without any erythema or swelling.  Tonsillar pillars symmetric, uvula appears normal.  No sublingual or submandibular tenderness palpation, no neck masses palpable.  Mild tender symmetric bilateral anterior cervical lymphadenopathy. Cardiology: RRR, no murmurs/rubs/gallops. Resp: Normal respiratory rate and effort. CTAB, no wheezes, rhonchi, crackles. No stridor. Abd: Soft, non-tender, non-distended. No rebound tenderness or guarding.  GU: Deferred. MSK: No peripheral edema or signs of trauma. Extremities without deformity or TTP. Skin: warm, dry.  Neuro: A&Ox4, CNs II-XII grossly intact. MAEs. Sensation grossly intact.  Psych: Normal mood and affect.   ED Results / Procedures / Treatments   Labs (all labs  ordered are listed, but only abnormal results are displayed) Labs Reviewed  BASIC METABOLIC PANEL - Abnormal; Notable for the following components:      Result Value   Potassium 3.2 (*)    Calcium 8.2 (*)    All other components within normal limits  CBC WITH DIFFERENTIAL/PLATELET  I-STAT BETA HCG BLOOD, ED ([facility], [facility], [facility] ONLY)    EKG None  Radiology See ED course  Procedures Procedures    Medications Ordered in  ED Medications  dexamethasone (DECADRON) tablet 6 mg (6 mg Oral Given 10/11/22 2236)    ED Course/ Medical Decision Making/ A&P                          Medical Decision Making Amount and/or Complexity of Data Reviewed Labs: ordered. Decision-making details documented in ED Course. Radiology: ordered. Decision-making details documented in ED Course.  Risk Prescription drug management.    This patient presents to the ED for concern of cough, tongue swelling; this involves an extensive number of treatment options, and is a complaint that carries with it a high risk of complications and morbidity.  I considered the following differential and admission for this acute, potentially life threatening condition.   MDM:    For patient's reported tongue swelling, I have not observed any tongue swelling on exam.  There are no concerning findings in the patient's oral or oropharyngeal exam that would indicate an acute emergency, including angioedema, Ludwig's angina, RPA/PTA/epiglottitis.  She has normal voice without any muffling.  She does not have any tooth pain or tender palpation on exam that would indicate a periapical abscess or dental infection.  I do not see any notable findings in the patient's mouth but she reports that she feels as though there is an area that is swollen so I will treat with Decadron to help treat oral swelling.  She does have some mild anterior tender lymphadenopathy likely related to her upper respiratory illness, but is symmetric and tender palpation, overall very reassuring.  She is stable from a respiratory standpoint with no stridor or respiratory distress and I believe she is stable for discharge from the standpoint and can be followed up as an outpatient.  For the patient's persistent cough and dyspnea on exertion, chest x-ray today does not show any evidence of pneumonia.  Consider ongoing bronchitis, another upper respiratory infection that is potentially viral.  It  does not seem as she has been aided by antibiotics and I do not believe she requires another course of antibiotics at this point without any focal consolidation noted on chest x-ray as well as with no fever or leukocytosis.  I discussed with patient possible causes of subacute and potentially chronic cough including GERD.  She does report that she has some reflux symptoms sometimes and does not take anything for it so I advised her to take 20 mg of Pepcid twice per day for 30 days as this could be contributing to her cough.  Patient states that she does not have any relief or improvement of her symptoms with the albuterol inhaler or prednisone and so therefore I doubt she has asthma or reactive airway disease/COPD.  She does not have any wheezing on exam currently either to suggest an asthma exacerbation.  Again, patient is stable from a respiratory standpoint and I believe she can follow-up outpatient for this problem.  I advised her to make an appointment with her PCP. She does not have any  children and there are no children in the home, and so will prescribe Tessalon Perles as she states these have also helped her.    Clinical Course as of 10/23/22 1542  Mon Oct 11, 2022  2151 DG Chest 2 View FINDINGS: The heart and mediastinal contours are within normal limits.  No focal consolidation. No pulmonary edema. No pleural effusion. No pneumothorax.  No acute osseous abnormality.  IMPRESSION: No active cardiopulmonary disease.   [HN]  2151 CBC with Differential [HN]  2151 Basic metabolic panel(!) [HN]  2151 I-Stat beta hCG blood, ED ([facility], [facility], [facility] only) Unremarkable in context of presentation [HN]    Clinical Course User Index [HN] Loetta Rough, MD    Labs: I Ordered, and personally interpreted labs.  The pertinent results include:  those listed above  Imaging Studies ordered: I ordered imaging studies including CXR I independently visualized and interpreted imaging. I agree with the  radiologist interpretation  Additional history obtained from chart review.    Reevaluation: After the interventions noted above, I reevaluated the patient and found that they have :improved  Social Determinants of Health: Patient lives independently   Disposition: DC w/ discharge instructions/return precautions. All questions answered to patient's satisfaction.    Co morbidities that complicate the patient evaluation  Past Medical History:  Diagnosis Date   Abnormal Pap smear    Anemia    Benign neoplasm of pituitary gland (HCC)    Bipolar 1 disorder, depressed (HCC)    biopolar   Depression    Galactorrhea    Migraine    without aura   Nausea    Ulcer      Medicines Meds ordered this encounter  Medications   dexamethasone (DECADRON) tablet 6 mg   benzonatate (TESSALON) 100 MG capsule    Sig: Take 1 capsule (100 mg total) by mouth every 8 (eight) hours.    Dispense:  21 capsule    Refill:  0   famotidine (PEPCID) 20 MG tablet    Sig: Take 1 tablet (20 mg total) by mouth 2 (two) times daily.    Dispense:  30 tablet    Refill:  0    I have reviewed the patients home medicines and have made adjustments as needed  Problem List / ED Course: Problem List Items Addressed This Visit   None Visit Diagnoses     Mouth swelling    -  Primary   Subacute cough                       This note was created using dictation software, which may contain spelling or grammatical errors.    Loetta Rough, MD 10/23/22 [phone]

## 2022-10-11 NOTE — ED Notes (Signed)
Patient has multiple complaints and wants to also get tested for stds.

## 2022-10-15 ENCOUNTER — Telehealth: Payer: Self-pay

## 2022-10-15 NOTE — Telephone Encounter (Signed)
Transition Care Management Unsuccessful Follow-up Telephone Call  Date of discharge and from where:  [facility] 5/27  Attempts:  1st Attempt  Reason for unsuccessful TCM follow-up call:  Left voice message   Lenard Forth [facility] Guide, [facility] [phone] [address] [place], [place], [place] [address] Phone: [phone] Email: [email or web address]

## 2022-10-19 ENCOUNTER — Telehealth: Payer: Self-pay

## 2022-10-19 NOTE — Telephone Encounter (Signed)
Transition Care Management Unsuccessful Follow-up Telephone Call  Date of discharge and from where:  [facility] 5/27  Attempts:  2nd Attempt  Reason for unsuccessful TCM follow-up call:  Unable to leave message   Lenard Forth [facility] Guide, [facility] [phone] [address] [place], [place], [place] [address] Phone: [phone] Email: [email or web address]

## 2022-11-24 ENCOUNTER — Emergency Department (HOSPITAL_COMMUNITY)
Admission: EM | Admit: 2022-11-24 | Discharge: 2022-11-24 | Disposition: A | Discharge status: Home / Self Care | Payer: Medicare HMO | Attending: Emergency Medicine | Admitting: Emergency Medicine

## 2022-11-24 ENCOUNTER — Other Ambulatory Visit: Payer: Self-pay

## 2022-11-24 ENCOUNTER — Emergency Department (HOSPITAL_COMMUNITY): Payer: Medicare HMO

## 2022-11-24 ENCOUNTER — Encounter (HOSPITAL_COMMUNITY): Payer: Self-pay

## 2022-11-24 DIAGNOSIS — S62619A Displaced fracture of proximal phalanx of unspecified finger, initial encounter for closed fracture: Secondary | ICD-10-CM

## 2022-11-24 DIAGNOSIS — S60413A Abrasion of left middle finger, initial encounter: Secondary | ICD-10-CM | POA: Diagnosis not present

## 2022-11-24 DIAGNOSIS — S62641A Nondisplaced fracture of proximal phalanx of left index finger, initial encounter for closed fracture: Secondary | ICD-10-CM | POA: Insufficient documentation

## 2022-11-24 DIAGNOSIS — W230XXA Caught, crushed, jammed, or pinched between moving objects, initial encounter: Secondary | ICD-10-CM | POA: Insufficient documentation

## 2022-11-24 DIAGNOSIS — S6992XA Unspecified injury of left wrist, hand and finger(s), initial encounter: Secondary | ICD-10-CM | POA: Diagnosis present

## 2022-11-24 MED ORDER — OXYCODONE-ACETAMINOPHEN 5-325 MG PO TABS
1.0000 | ORAL_TABLET | Freq: Once | ORAL | Status: AC
  Administered 2022-11-24: 1 via ORAL
  Filled 2022-11-24: qty 1

## 2022-11-24 MED ORDER — OXYCODONE-ACETAMINOPHEN 5-325 MG PO TABS: 1.0000 | ORAL_TABLET | Freq: Four times a day (QID) | ORAL | 0 refills | Status: AC | PRN

## 2022-11-24 NOTE — Discharge Instructions (Addendum)
It was a pleasure caring for you.  X-ray showed a nondisplaced fracture of your index finger.  We have splinted it here in the emergency room today.  I will send you with some pain control for the next few days.  You will need to follow-up with hand surgery.  Information for this clinic is provided in the discharge paperwork.  Seek emergency care if experiencing any new or worsening symptoms.  Alternating between 650 mg Tylenol and 400 mg Advil: The best way to alternate taking Acetaminophen (example Tylenol) and Ibuprofen (example Advil/Motrin) is to take them 3 hours apart. For example, if you take ibuprofen at 6 am you can then take Tylenol at 9 am. You can continue this regimen throughout the day, making sure you do not exceed the recommended maximum dose for each drug.

## 2022-11-24 NOTE — ED Triage Notes (Addendum)
Pt states her right hand got smashed between a big piece of tile and the dumpster an hour ago. Pt's second and third digit and knuckles has 1+ swelling. Pt unable to bend fingers due to pain and swelling

## 2022-11-24 NOTE — ED Notes (Signed)
Patient verbalizes understanding of discharge instructions. Opportunity for questioning and answers were provided. Pt discharged from ED. 

## 2022-11-24 NOTE — ED Provider Notes (Signed)
[place] EMERGENCY DEPARTMENT AT [facility] Provider Note   CSN: [id number] Arrival date & time: 11/24/22  1054     History {Add pertinent medical, surgical, social history, OB history to HPI:1} Chief Complaint  Patient presents with   Hand Injury    Kathryn Mcneil is a 42 y.o. female.  Presents to the emergency room concerned with left hand pain after crushing hand but tween a wall and dumpster around an hour ago.  Patient states that fingers hurt a lot, but she is able to feel.   Hand Injury      Home Medications Prior to Admission medications   Medication Sig Start Date End Date Taking? Authorizing Provider  benzonatate (TESSALON) 100 MG capsule Take 1 capsule (100 mg total) by mouth every 8 (eight) hours. 10/11/22   Loetta Rough, MD  budesonide-formoterol (SYMBICORT) 80-4.5 MCG/ACT inhaler Inhale 2 puffs into the lungs 2 (two) times daily as needed. 09/15/22   Merrilee Jansky, MD  famotidine (PEPCID) 20 MG tablet Take 1 tablet (20 mg total) by mouth 2 (two) times daily. 10/11/22   Loetta Rough, MD  predniSONE (DELTASONE) 20 MG tablet Take 2 tablets (40 mg total) by mouth daily with breakfast. For the next four days 10/07/22   Gerhard Munch, MD      Allergies    Patient has no known allergies.    Review of Systems   Review of Systems  Physical Exam Updated Vital Signs BP 103/76 (BP Location: Right Arm)   Pulse 84   Temp 98.3 F (36.8 C) (Oral)   Resp 17   Ht 5\' 4"  (1.626 m)   Wt 52.2 kg   SpO2 97%   BMI 19.75 kg/m  Physical Exam  ED Results / Procedures / Treatments   Labs (all labs ordered are listed, but only abnormal results are displayed) Labs Reviewed - No data to display  EKG None  Radiology DG Hand Complete Left  Result Date: 11/24/2022 CLINICAL DATA:  Patient states her hand got smashed between a big piece of tile and the dumpster. Second and third digit swelling. EXAM: LEFT HAND - COMPLETE 3 VIEW COMPARISON:  None  Available. FINDINGS: There is a comminuted nondisplaced fracture of the mid shaft of the second proximal phalanx. There is no intra-articular extension. There is moderate overlying soft tissue swelling. No radiopaque foreign body. No additional acute fracture or dislocation is visualized. IMPRESSION: There is comminuted nondisplaced fracture of the mid shaft of the second proximal phalanx. Electronically Signed   By: Jacob Moores M.D.   On: 11/24/2022 11:26    Procedures Procedures  {Document cardiac monitor, telemetry assessment procedure when appropriate:1}  Medications Ordered in ED Medications  oxyCODONE-acetaminophen (PERCOCET/ROXICET) 5-325 MG per tablet 1 tablet (has no administration in time range)    ED Course/ Medical Decision Making/ A&P   {   Click here for ABCD2, HEART and other calculatorsREFRESH Note before signing :1}                          Medical Decision Making Amount and/or Complexity of Data Reviewed Radiology: ordered.  Risk Prescription drug management.   This patient presents to the ED for concern of ***, this involves an extensive number of treatment options, and is a complaint that carries with it a high risk of complications and morbidity.  The differential diagnosis includes hemarthrosis, gout, septic joint, fracture, tendonitis, carpal tunnel syndrome, muscle strain, bursitis, compartment  syndrome   Co morbidities that complicate the patient evaluation  ***   Additional history obtained:  Additional history obtained from *** External records from outside source obtained and reviewed including ***   Lab Tests:  I Ordered, and personally interpreted labs.  The pertinent results include:  ***   Imaging Studies ordered:  I ordered imaging studies including Xray - I independently visualized and interpreted imaging Shared findings with patient I agree with the radiologist interpretation   Cardiac Monitoring: / EKG:  The patient was  maintained on a cardiac monitor.  I personally viewed and interpreted the cardiac monitored which showed an underlying rhythm of: ***   Consultations Obtained:  I requested consultation with the ***,  and discussed lab and imaging findings as well as pertinent plan - they recommend: ***   Problem List / ED Course / Critical interventions / Medication management  *** Provided patient with Percocet. Finger n/v intact. Splint and buddy tape. Educated patient about need for follow up with Hydrograph[employer]or. I have reviewed the patients home medicines and have made adjustments as needed   Ddx these are considered less likely due to history of present illness and physical exam -hemarthrosis: joint without swelling; ROM intact -gout: no warmth or erythema; ROM intact  -septic joint: afebrile; no warmth or erythema; no skin changes; ROM intact  -fracture: xray without concern  -tendonitis -carpal tunnel syndrome: negative finkelstein test -muscle strain  -bursitis -compartment syndrome: area not tense; neurovascularly intact   Social Determinants of Health:  ***   Test / Admission - Considered:  ***   {Document critical care time when appropriate:1} {Document review of labs and clinical decision tools ie heart score, Chads2Vasc2 etc:1}  {Document your independent review of radiology images, and any outside records:1} {Document your discussion with family members, caretakers, and with consultants:1} {Document social determinants of health affecting pt's care:1} {Document your decision making why or why not admission, treatments were needed:1} Final Clinical Impression(s) / ED Diagnoses Final diagnoses:  None    Rx / DC Orders ED Discharge Orders     None

## 2022-12-18 ENCOUNTER — Encounter (HOSPITAL_COMMUNITY): Payer: Self-pay

## 2022-12-18 ENCOUNTER — Other Ambulatory Visit: Payer: Self-pay

## 2022-12-18 ENCOUNTER — Emergency Department (HOSPITAL_COMMUNITY)
Admission: EM | Admit: 2022-12-18 | Discharge: 2022-12-18 | Disposition: A | Discharge status: Home / Self Care | Payer: Medicare HMO | Attending: Emergency Medicine | Admitting: Emergency Medicine

## 2022-12-18 DIAGNOSIS — N920 Excessive and frequent menstruation with regular cycle: Secondary | ICD-10-CM | POA: Insufficient documentation

## 2022-12-18 DIAGNOSIS — N938 Other specified abnormal uterine and vaginal bleeding: Secondary | ICD-10-CM | POA: Insufficient documentation

## 2022-12-18 LAB — BASIC METABOLIC PANEL
Anion gap: 13 (ref 5–15)
BUN: 12 mg/dL (ref 6–20)
CO2: 20 mmol/L — ABNORMAL LOW (ref 22–32)
Calcium: 9.1 mg/dL (ref 8.9–10.3)
Chloride: 105 mmol/L (ref 98–111)
Creatinine, Ser: 0.66 mg/dL (ref 0.44–1.00)
GFR, Estimated: >60 mL/min (ref >60)
Glucose, Bld: 125 mg/dL — ABNORMAL HIGH (ref 70–99)
Potassium: 4 mmol/L (ref 3.5–5.1)
Sodium: 138 mmol/L (ref 135–145)

## 2022-12-18 LAB — CBC
HCT: 37.3 % (ref 36.0–46.0)
Hemoglobin: 12.3 g/dL (ref 12.0–15.0)
MCH: 31.1 pg (ref 26.0–34.0)
MCHC: 33 g/dL (ref 30.0–36.0)
MCV: 94.2 fL (ref 80.0–100.0)
Platelets: 288 10*3/uL (ref 150–400)
RBC: 3.96 MIL/uL (ref 3.87–5.11)
RDW: 14.8 % (ref 11.5–15.5)
WBC: 6.3 10*3/uL (ref 4.0–10.5)
nRBC: 0 % (ref 0.0–0.2)

## 2022-12-18 LAB — HIV ANTIBODY (ROUTINE TESTING W REFLEX): HIV Screen 4th Generation wRfx: NONREACTIVE

## 2022-12-18 LAB — WET PREP, GENITAL
Clue Cells Wet Prep HPF POC: NONE SEEN
Sperm: NONE SEEN
Trich, Wet Prep: NONE SEEN
WBC, Wet Prep HPF POC: <10 (ref <10)
Yeast Wet Prep HPF POC: NONE SEEN

## 2022-12-18 LAB — HCG, SERUM, QUALITATIVE: Preg, Serum: NEGATIVE

## 2022-12-18 MED ORDER — MEDROXYPROGESTERONE ACETATE 10 MG PO TABS: 10.0000 mg | ORAL_TABLET | Freq: Every day | ORAL | 0 refills | Status: AC

## 2022-12-18 NOTE — ED Provider Notes (Signed)
[place] EMERGENCY DEPARTMENT AT [facility] Provider Note   CSN: [id number] Arrival date & time: 12/18/22  1725     History  Chief Complaint  Patient presents with   Vaginal Bleeding    Kathryn Mcneil is a 42 y.o. female.  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  Recently in the last 6 to 8 months her menses have lasted 3 to 4 days at the most and have been light but this 1 has been very heavy.  It is only gotten heavier in the last 36 hours to where now she has blood through 4 outfits and been using a super tampon and a pad every hour.  She is now starting to pass clots and felt that when she looked in her perineal area there was something protruding from her vagina.  She is not having significant pain in her abdomen denies any urinary symptoms or issues with her bowel movements.  She is sexually active with 1 partner and does not use protection and last time she had sex was 1 week ago.  She takes no medications regularly and denies history of bleeding issues.  The history is provided by the patient.  Vaginal Bleeding      Home Medications Prior to Admission medications   Medication Sig Start Date End Date Taking? Authorizing Provider  medroxyPROGESTERone (PROVERA) 10 MG tablet Take 1 tablet (10 mg total) by mouth daily. 12/18/22  Yes Madolyn Ackroyd, Alphonzo Lemmings, MD  benzonatate (TESSALON) 100 MG capsule Take 1 capsule (100 mg total) by mouth every 8 (eight) hours. 10/11/22   Loetta Rough, MD  budesonide-formoterol (SYMBICORT) 80-4.5 MCG/ACT inhaler Inhale 2 puffs into the lungs 2 (two) times daily as needed. 09/15/22   Merrilee Jansky, MD  famotidine (PEPCID) 20 MG tablet Take 1 tablet (20 mg total) by mouth 2 (two) times daily. 10/11/22   Loetta Rough, MD  predniSONE (DELTASONE)  20 MG tablet Take 2 tablets (40 mg total) by mouth daily with breakfast. For the next four days 10/07/22   Gerhard Munch, MD      Allergies    Patient has no known allergies.    Review of Systems   Review of Systems  Genitourinary:  Positive for vaginal bleeding.    Physical Exam Updated Vital Signs BP 96/73   Pulse 63   Temp 98.6 F (37 C) (Oral)   Resp 17   Wt 52.2 kg   SpO2 100%   BMI 19.74 kg/m  Physical Exam Vitals and nursing note reviewed.  Constitutional:      General: She is not in acute distress.    Appearance: She is well-developed.  HENT:     Head: Normocephalic and atraumatic.  Eyes:     Pupils: Pupils are equal, round, and reactive to light.  Cardiovascular:     Rate and Rhythm: Normal rate and regular rhythm.     Heart sounds: Normal heart sounds. No murmur heard.    No friction rub.  Pulmonary:     Effort: Pulmonary effort is normal.     Breath sounds: Normal breath sounds. No wheezing or rales.  Abdominal:     General: Bowel sounds are normal. There is no distension.     Palpations: Abdomen is soft.     Tenderness: There is no  abdominal tenderness. There is no guarding or rebound.  Genitourinary:    Comments: Blood noted in the vaginal vault but no large clots or significant bleeding or pooling at this time.  No notable abnormalities in the vaginal canal Musculoskeletal:        General: No tenderness. Normal range of motion.     Comments: No edema  Skin:    General: Skin is warm and dry.     Findings: No rash.  Neurological:     Mental Status: She is alert and oriented to person, place, and time.     Cranial Nerves: No cranial nerve deficit.  Psychiatric:        Behavior: Behavior normal.     ED Results / Procedures / Treatments   Labs (all labs ordered are listed, but only abnormal results are displayed) Labs Reviewed  BASIC METABOLIC PANEL - Abnormal; Notable for the following components:      Result Value   CO2 20 (*)    Glucose,  Bld 125 (*)    All other components within normal limits  WET PREP, GENITAL  HCG, SERUM, QUALITATIVE  CBC  HIV ANTIBODY (ROUTINE TESTING W REFLEX)  RPR  GC/CHLAMYDIA PROBE AMP ([place]) NOT AT [facility]    EKG None  Radiology No results found.  Procedures Procedures    Medications Ordered in ED Medications - No data to display  ED Course/ Medical Decision Making/ A&P                                 Medical Decision Making Amount and/or Complexity of Data Reviewed Labs: ordered. Decision-making details documented in ED Course.  Risk Prescription drug management.   Pt with multiple medical problems and comorbidities and presenting today with a complaint that caries a high risk for morbidity and mortality.  Here today with complaint of heavy vaginal bleeding.  Low suspicion for STI.  Patient does not take any anticoagulation.  Could be menorrhagia from fluctuation in hormones.  Will ensure no evidence of ITP but patient has no petechia elsewhere or other signs of bleeding.  Well-appearing here with normal vital signs.  11:42 PM No foreign bodies noted in the vagina and no abnormalities in the vaginal canal.  Patient does have some blood noted but no significant clots or active bleeding from the os.  Patient's wet prep without acute findings, serum hCG is negative and HIV is negative.  CBC with stable hemoglobin and BMP without acute findings.  This was discussed with the patient.  At this time low suspicion for STI.  Most likely menorrhagia but did counsel patient on following up with OB/GYN especially if she starts having more heavy menses that she may need check of her hormones given her known pituitary tumor.  Patient does not need any further testing at this time and is stable for discharge home.  She was given a prescription for Provera to fill if she continued to have heavy bleeding the next 2 to 3 days.         Final Clinical Impression(s) / ED Diagnoses Final  diagnoses:  Menorrhagia with regular cycle    Rx / DC Orders ED Discharge Orders          Ordered    medroxyPROGESTERone (PROVERA) 10 MG tablet  Daily        12/18/22 2038  Gwyneth Sprout, MD 12/18/22 [phone]

## 2022-12-18 NOTE — Discharge Instructions (Addendum)
All the blood work looks okay and the initial swabs show no signs of infection.  However cultures were done and if anything is abnormal somebody will call you.  You were given a prescription but you only need to use it if you continue to have bleeding that is extremely heavy in the next few days.

## 2022-12-18 NOTE — ED Triage Notes (Signed)
Pt arrives with c/I vaginal bleeding that started yesterday. Per pt, bleeding is heavy and she is was using a pads every hour yesterday and today she is having clots passing. Per pt, she feels like something is protruding from her vagina.

## 2022-12-19 LAB — RPR: RPR Ser Ql: NONREACTIVE

## 2022-12-20 ENCOUNTER — Telehealth: Payer: Self-pay | Admitting: *Deleted

## 2022-12-20 LAB — GC/CHLAMYDIA PROBE AMP (~~LOC~~) NOT AT ARMC
Chlamydia: NEGATIVE
Comment: NEGATIVE
Comment: NORMAL
Neisseria Gonorrhea: NEGATIVE

## 2022-12-20 NOTE — Telephone Encounter (Signed)
Pt called to receive test results as her mychart isn't working.  RNCM read results as listed and helped reset password.  No additional RNCM needs identified at this time.

## 2023-05-29 ENCOUNTER — Other Ambulatory Visit: Payer: Self-pay

## 2023-05-29 ENCOUNTER — Emergency Department (HOSPITAL_COMMUNITY): Payer: Medicare HMO

## 2023-05-29 ENCOUNTER — Encounter (HOSPITAL_COMMUNITY): Payer: Self-pay | Admitting: *Deleted

## 2023-05-29 ENCOUNTER — Emergency Department (HOSPITAL_COMMUNITY)
Admission: EM | Admit: 2023-05-29 | Discharge: 2023-05-29 | Disposition: A | Discharge status: Home / Self Care | Payer: Medicare HMO | Attending: Emergency Medicine | Admitting: Emergency Medicine

## 2023-05-29 DIAGNOSIS — W01198A Fall on same level from slipping, tripping and stumbling with subsequent striking against other object, initial encounter: Secondary | ICD-10-CM | POA: Diagnosis not present

## 2023-05-29 DIAGNOSIS — Z85841 Personal history of malignant neoplasm of brain: Secondary | ICD-10-CM | POA: Diagnosis not present

## 2023-05-29 DIAGNOSIS — M79641 Pain in right hand: Secondary | ICD-10-CM | POA: Diagnosis not present

## 2023-05-29 DIAGNOSIS — R4182 Altered mental status, unspecified: Secondary | ICD-10-CM | POA: Diagnosis not present

## 2023-05-29 DIAGNOSIS — S60222A Contusion of left hand, initial encounter: Secondary | ICD-10-CM | POA: Insufficient documentation

## 2023-05-29 DIAGNOSIS — M1812 Unilateral primary osteoarthritis of first carpometacarpal joint, left hand: Secondary | ICD-10-CM | POA: Diagnosis not present

## 2023-05-29 DIAGNOSIS — M25531 Pain in right wrist: Secondary | ICD-10-CM | POA: Diagnosis not present

## 2023-05-29 DIAGNOSIS — M25532 Pain in left wrist: Secondary | ICD-10-CM | POA: Diagnosis not present

## 2023-05-29 DIAGNOSIS — S60229A Contusion of unspecified hand, initial encounter: Secondary | ICD-10-CM

## 2023-05-29 DIAGNOSIS — Q740 Other congenital malformations of upper limb(s), including shoulder girdle: Secondary | ICD-10-CM | POA: Diagnosis not present

## 2023-05-29 DIAGNOSIS — S0081XA Abrasion of other part of head, initial encounter: Secondary | ICD-10-CM | POA: Diagnosis not present

## 2023-05-29 DIAGNOSIS — S60221A Contusion of right hand, initial encounter: Secondary | ICD-10-CM | POA: Insufficient documentation

## 2023-05-29 DIAGNOSIS — S6991XA Unspecified injury of right wrist, hand and finger(s), initial encounter: Secondary | ICD-10-CM | POA: Diagnosis not present

## 2023-05-29 DIAGNOSIS — M79642 Pain in left hand: Secondary | ICD-10-CM | POA: Diagnosis not present

## 2023-05-29 DIAGNOSIS — S060XAA Concussion with loss of consciousness status unknown, initial encounter: Secondary | ICD-10-CM | POA: Diagnosis not present

## 2023-05-29 DIAGNOSIS — S0990XA Unspecified injury of head, initial encounter: Secondary | ICD-10-CM | POA: Diagnosis not present

## 2023-05-29 MED ORDER — ACETAMINOPHEN 500 MG PO TABS
1000.0000 mg | ORAL_TABLET | Freq: Once | ORAL | Status: AC
  Administered 2023-05-29: 1000 mg via ORAL
  Filled 2023-05-29: qty 2

## 2023-05-29 MED ORDER — KETOROLAC TROMETHAMINE 15 MG/ML IJ SOLN
15.0000 mg | Freq: Once | INTRAMUSCULAR | Status: AC
  Administered 2023-05-29: 15 mg via INTRAMUSCULAR
  Filled 2023-05-29: qty 1

## 2023-05-29 NOTE — Discharge Instructions (Signed)
 Your imaging here looks good.  No obvious broken bones in the hand or the wrist.  Your CT scan of your head did not show any broken bones of the skull or any bleeding inside the skull.  Please follow-up with your family doctor in the office.  Take 4 over the counter ibuprofen tablets 3 times a day or 2 over-the-counter naproxen tablets twice a day for pain. Also take tylenol  1000mg (2 extra strength) four times a day.

## 2023-05-29 NOTE — ED Provider Notes (Signed)
 [place] EMERGENCY DEPARTMENT AT [facility] Provider Note   CSN: [id number] Arrival date & time: 05/29/23  1130     History  Chief Complaint  Patient presents with   Hand Injury    Kathryn Mcneil is a 43 y.o. female.  43 yo F with a chief complaint of a fall.  She said she was running in the woods in the middle of the night since she tripped on something and fell.  She is not sure exactly how she landed.  This happened about 4 days ago.  She feels like the swelling is gotten better but still feels like she has significant tenderness to both hands.  When asked where it hurts she says everywhere.  She thinks maybe it is worse on the ulnar aspect.  She also struck the right side of her head and has been having recurrent headaches.  She had a couple episodes of vomiting after the event and has been dizzy.  She tells that she has a history of brain cancer and would like to have a picture to show that her things is ok inside of her head.   Hand Injury      Home Medications Prior to Admission medications   Medication Sig Start Date End Date Taking? Authorizing Provider  benzonatate  (TESSALON ) 100 MG capsule Take 1 capsule (100 mg total) by mouth every 8 (eight) hours. 10/11/22   Franklyn Sid SAILOR, MD  budesonide -formoterol  (SYMBICORT ) 80-4.5 MCG/ACT inhaler Inhale 2 puffs into the lungs 2 (two) times daily as needed. 09/15/22   LampteyAleene KIDD, MD  famotidine  (PEPCID ) 20 MG tablet Take 1 tablet (20 mg total) by mouth 2 (two) times daily. 10/11/22   Franklyn Sid SAILOR, MD  medroxyPROGESTERone  (PROVERA ) 10 MG tablet Take 1 tablet (10 mg total) by mouth daily. 12/18/22   Doretha Folks, MD  predniSONE  (DELTASONE ) 20 MG tablet Take 2 tablets (40 mg total) by mouth daily with breakfast. For the next four days 10/07/22   Garrick Charleston, MD      Allergies    Patient has no known allergies.    Review of Systems   Review of Systems  Physical Exam Updated Vital Signs BP 96/77    Pulse 69   Temp 98.1 F (36.7 C)   Resp 16   Ht 5' 4 (1.626 m)   Wt 52.2 kg   LMP 05/18/2023   SpO2 100%   BMI 19.75 kg/m  Physical Exam Vitals and nursing note reviewed.  Constitutional:      General: She is not in acute distress.    Appearance: She is well-developed. She is not diaphoretic.  HENT:     Head: Normocephalic.     Comments: Small abrasion to the right frontal region. Eyes:     Pupils: Pupils are equal, round, and reactive to light.  Cardiovascular:     Rate and Rhythm: Normal rate and regular rhythm.     Heart sounds: No murmur heard.    No friction rub. No gallop.  Pulmonary:     Effort: Pulmonary effort is normal.     Breath sounds: No wheezing or rales.  Abdominal:     General: There is no distension.     Palpations: Abdomen is soft.     Tenderness: There is no abdominal tenderness.  Musculoskeletal:        General: Tenderness present.     Cervical back: Normal range of motion and neck supple.     Comments: Diffuse pain  about the wrist bilaterally.  Patient is unable or unwilling to move the fingers or make a fist.  No pain at the elbows or shoulders bilaterally.  No obvious midline C-spine tenderness.  Able to rotate her head 45 degrees in either direction without discomfort.  Skin:    General: Skin is warm and dry.  Neurological:     Mental Status: She is alert and oriented to person, place, and time.  Psychiatric:        Behavior: Behavior normal.     ED Results / Procedures / Treatments   Labs (all labs ordered are listed, but only abnormal results are displayed) Labs Reviewed - No data to display  EKG None  Radiology CT Head Wo Contrast Result Date: 05/29/2023 CLINICAL DATA:  Head trauma, altered mental status EXAM: CT HEAD WITHOUT CONTRAST TECHNIQUE: Contiguous axial images were obtained from the base of the skull through the vertex without intravenous contrast. RADIATION DOSE REDUCTION: This exam was performed according to the  departmental dose-optimization program which includes automated exposure control, adjustment of the mA and/or kV according to patient size and/or use of iterative reconstruction technique. COMPARISON:  None Available. FINDINGS: Brain: No evidence of acute infarction, hemorrhage, mass, mass effect, or midline shift. No hydrocephalus or extra-axial fluid collection. Vascular: No hyperdense vessel. Skull: Negative for fracture or focal lesion. Sinuses/Orbits: No acute finding. Other: The mastoid air cells are well aerated. IMPRESSION: No acute intracranial process. Electronically Signed   By: Donald Campion M.D.   On: 05/29/2023 13:43   DG Wrist Complete Left Result Date: 05/29/2023 CLINICAL DATA:  Bilateral wrist pain EXAM: LEFT WRIST - COMPLETE 3 VIEW COMPARISON:  Concurrently obtained radiographs of the right wrist FINDINGS: There is no evidence of fracture or dislocation. There is no evidence of arthropathy or other focal bone abnormality. Soft tissues are unremarkable. IMPRESSION: Negative. Electronically Signed   By: Wilkie Lent M.D.   On: 05/29/2023 13:38   DG Wrist Complete Right Result Date: 05/29/2023 CLINICAL DATA:  Bilateral wrist pain EXAM: RIGHT WRIST - COMPLETE 3 VIEW COMPARISON:  Concurrently obtained radiographs of the left wrist FINDINGS: There is no evidence of fracture or dislocation. Small accessory ossicle adjacent to the trapezium. There is no evidence of arthropathy or other focal bone abnormality. Soft tissues are unremarkable. IMPRESSION: Negative. Electronically Signed   By: Wilkie Lent M.D.   On: 05/29/2023 13:38   DG Hand Complete Right Result Date: 05/29/2023 CLINICAL DATA:  43 year old female status post blunt trauma and fall 2 days ago. Pain. History of prior wrist surgery, avascular necrosis. EXAM: RIGHT HAND - COMPLETE 3+ VIEW COMPARISON:  Right wrist series 06/27/2021, right hand series 06/24/2016, report of wrist MRI 03/13/2019. FINDINGS: Proximal right carpal row  mostly absent as before. Background bone mineralization remains normal. Distal radius and ulna appear stable and intact. Residual carpal bone alignment and appearance stable. CMC joints appears stable and aligned. No metacarpal or phalanx fracture identified. IMPRESSION: Stable chronic and postoperative changes to the right wrist. No acute fracture or dislocation identified about the right hand. Electronically Signed   By: VEAR Hurst M.D.   On: 05/29/2023 12:42   DG Hand Complete Left Result Date: 05/29/2023 CLINICAL DATA:  Left hand pain after recent fall. EXAM: LEFT HAND - COMPLETE 3+ VIEW COMPARISON:  Left hand x-rays dated November 24, 2022. FINDINGS: There is no evidence of fracture or dislocation. Mild degenerative changes of the first [facility] joint. Soft tissues are unremarkable. IMPRESSION: 1. No acute osseous  abnormality. Electronically Signed   By: Elsie ONEIDA Shoulder M.D.   On: 05/29/2023 12:41    Procedures Procedures    Medications Ordered in ED Medications  acetaminophen  (TYLENOL ) tablet 1,000 mg (has no administration in time range)  ketorolac  (TORADOL ) 15 MG/ML injection 15 mg (has no administration in time range)    ED Course/ Medical Decision Making/ A&P                                 Medical Decision Making Amount and/or Complexity of Data Reviewed Radiology: ordered.  Risk OTC drugs. Prescription drug management.   44 yo F with a cc of a fall.  The patient was apparently running in the woods in the dark and she fell and hurt both of her hands and struck her head.  This was about 4 days ago and now she would like imaging performed to make sure that nothing is injured.  I discussed with her the lack of utility of CT imaging of the head.  Risk-benefit discussion at bedside she is electing head CT performed.  Plain film of the hands independently interpreted by me without fracture.  Patient complaining mostly of pain to the wrist will obtain dedicated wrist films.  Plain film of the  wrist bilaterally independently interpreted by me without fracture.  CT of the head without obvious intracranial hemorrhage.  Will discharge home.  PCP follow-up.  1:54 PM:  I have discussed the diagnosis/risks/treatment options with the patient.  Evaluation and diagnostic testing in the emergency department does not suggest an emergent condition requiring admission or immediate intervention beyond what has been performed at this time.  They will follow up with PCP. We also discussed returning to the ED immediately if new or worsening sx occur. We discussed the sx which are most concerning (e.g., sudden worsening pain, fever, inability to tolerate by mouth) that necessitate immediate return. Medications administered to the patient during their visit and any new prescriptions provided to the patient are listed below.  Medications given during this visit Medications  acetaminophen  (TYLENOL ) tablet 1,000 mg (has no administration in time range)  ketorolac  (TORADOL ) 15 MG/ML injection 15 mg (has no administration in time range)     The patient appears reasonably screen and/or stabilized for discharge and I doubt any other medical condition or other [facility] requiring further screening, evaluation, or treatment in the ED at this time prior to discharge.          Final Clinical Impression(s) / ED Diagnoses Final diagnoses:  Contusion of hand, unspecified laterality, initial encounter  Concussion with unknown loss of consciousness status, initial encounter    Rx / DC Orders ED Discharge Orders     None         Emil Share, DO 05/29/23 1355
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
## 2023-11-25 ENCOUNTER — Emergency Department (HOSPITAL_COMMUNITY): Admission: EM | Admit: 2023-11-25 | Discharge: 2023-11-25 | Disposition: A | Discharge status: Home / Self Care

## 2023-11-25 ENCOUNTER — Emergency Department (HOSPITAL_COMMUNITY)

## 2023-11-25 ENCOUNTER — Other Ambulatory Visit: Payer: Self-pay

## 2023-11-25 DIAGNOSIS — I959 Hypotension, unspecified: Secondary | ICD-10-CM | POA: Diagnosis not present

## 2023-11-25 DIAGNOSIS — J705 Respiratory conditions due to smoke inhalation: Secondary | ICD-10-CM | POA: Insufficient documentation

## 2023-11-25 DIAGNOSIS — R0602 Shortness of breath: Secondary | ICD-10-CM | POA: Diagnosis not present

## 2023-11-25 DIAGNOSIS — R000 Tachycardia, unspecified: Secondary | ICD-10-CM | POA: Diagnosis not present

## 2023-11-25 DIAGNOSIS — T59811A Toxic effect of smoke, accidental (unintentional), initial encounter: Secondary | ICD-10-CM | POA: Insufficient documentation

## 2023-11-25 DIAGNOSIS — F419 Anxiety disorder, unspecified: Secondary | ICD-10-CM | POA: Insufficient documentation

## 2023-11-25 DIAGNOSIS — T59814A Toxic effect of smoke, undetermined, initial encounter: Secondary | ICD-10-CM | POA: Diagnosis not present

## 2023-11-25 DIAGNOSIS — R0689 Other abnormalities of breathing: Secondary | ICD-10-CM | POA: Diagnosis not present

## 2023-11-25 DIAGNOSIS — R069 Unspecified abnormalities of breathing: Secondary | ICD-10-CM | POA: Diagnosis not present

## 2023-11-25 LAB — COMPREHENSIVE METABOLIC PANEL WITH GFR
ALT: 11 U/L (ref 0–44)
AST: 20 U/L (ref 15–41)
Albumin: 3.7 g/dL (ref 3.5–5.0)
Alkaline Phosphatase: 65 U/L (ref 38–126)
Anion gap: 11 (ref 5–15)
BUN: 7 mg/dL (ref 6–20)
CO2: 19 mmol/L — ABNORMAL LOW (ref 22–32)
Calcium: 8.8 mg/dL — ABNORMAL LOW (ref 8.9–10.3)
Chloride: 106 mmol/L (ref 98–111)
Creatinine, Ser: 0.73 mg/dL (ref 0.44–1.00)
GFR, Estimated: >60 mL/min (ref >60)
Glucose, Bld: 95 mg/dL (ref 70–99)
Potassium: 3.8 mmol/L (ref 3.5–5.1)
Sodium: 136 mmol/L (ref 135–145)
Total Bilirubin: 0.9 mg/dL (ref 0.0–1.2)
Total Protein: 6.6 g/dL (ref 6.5–8.1)

## 2023-11-25 LAB — CBC WITH DIFFERENTIAL/PLATELET
Abs Immature Granulocytes: 0.03 K/uL (ref 0.00–0.07)
Basophils Absolute: 0 K/uL (ref 0.0–0.1)
Basophils Relative: 0 %
Eosinophils Absolute: 0.1 K/uL (ref 0.0–0.5)
Eosinophils Relative: 1 %
HCT: 36 % (ref 36.0–46.0)
Hemoglobin: 12.4 g/dL (ref 12.0–15.0)
Immature Granulocytes: 0 %
Lymphocytes Relative: 13 %
Lymphs Abs: 1.1 K/uL (ref 0.7–4.0)
MCH: 31 pg (ref 26.0–34.0)
MCHC: 34.4 g/dL (ref 30.0–36.0)
MCV: 90 fL (ref 80.0–100.0)
Monocytes Absolute: 0.5 K/uL (ref 0.1–1.0)
Monocytes Relative: 6 %
Neutro Abs: 6.8 K/uL (ref 1.7–7.7)
Neutrophils Relative %: 80 %
Platelets: 278 K/uL (ref 150–400)
RBC: 4 MIL/uL (ref 3.87–5.11)
RDW: 15 % (ref 11.5–15.5)
WBC: 8.5 K/uL (ref 4.0–10.5)
nRBC: 0 % (ref 0.0–0.2)

## 2023-11-25 LAB — I-STAT ARTERIAL BLOOD GAS, ED
Acid-base deficit: 1 mmol/L (ref 0.0–2.0)
Bicarbonate: 19 mmol/L — ABNORMAL LOW (ref 20.0–28.0)
Calcium, Ion: 1.19 mmol/L (ref 1.15–1.40)
HCT: 34 % — ABNORMAL LOW (ref 36.0–46.0)
Hemoglobin: 11.6 g/dL — ABNORMAL LOW (ref 12.0–15.0)
O2 Saturation: 98 %
Patient temperature: 97.6
Potassium: 3.7 mmol/L (ref 3.5–5.1)
Sodium: 137 mmol/L (ref 135–145)
TCO2: 20 mmol/L — ABNORMAL LOW (ref 22–32)
pCO2 arterial: 19.7 mmHg — CL (ref 32–48)
pH, Arterial: 7.59 — ABNORMAL HIGH (ref 7.35–7.45)
pO2, Arterial: 76 mmHg — ABNORMAL LOW (ref 83–108)

## 2023-11-25 LAB — COOXEMETRY PANEL
Carboxyhemoglobin: 3.9 % — ABNORMAL HIGH (ref 0.5–1.5)
Methemoglobin: <0.7 % (ref 0.0–1.5)
O2 Saturation: 96.4 %
Total hemoglobin: 12.7 g/dL (ref 12.0–16.0)

## 2023-11-25 MED ORDER — LORAZEPAM 1 MG PO TABS
1.0000 mg | ORAL_TABLET | Freq: Once | ORAL | Status: AC
  Administered 2023-11-25: 1 mg via ORAL
  Filled 2023-11-25: qty 1
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
## 2023-11-25 NOTE — Discharge Instructions (Addendum)
 [employer] @  [phone] [id number] [place], [place] [address]

## 2023-11-25 NOTE — Progress Notes (Signed)
 CSW added resources for [employer] to patient's AVS.  Niels Portugal, MSW, LCSW Transitions of Care  Clinical Social Worker II [phone]

## 2023-11-25 NOTE — ED Notes (Signed)
RT notified of order for ABG.

## 2024-01-12 ENCOUNTER — Emergency Department (HOSPITAL_COMMUNITY)
Admission: EM | Admit: 2024-01-12 | Discharge: 2024-01-12 | Disposition: A | Discharge status: Home / Self Care | Attending: Emergency Medicine | Admitting: Emergency Medicine

## 2024-01-12 ENCOUNTER — Encounter (HOSPITAL_COMMUNITY): Payer: Self-pay

## 2024-01-12 ENCOUNTER — Other Ambulatory Visit: Payer: Self-pay

## 2024-01-12 ENCOUNTER — Emergency Department (HOSPITAL_COMMUNITY)

## 2024-01-12 DIAGNOSIS — M5126 Other intervertebral disc displacement, lumbar region: Secondary | ICD-10-CM | POA: Insufficient documentation

## 2024-01-12 DIAGNOSIS — M47816 Spondylosis without myelopathy or radiculopathy, lumbar region: Secondary | ICD-10-CM | POA: Diagnosis not present

## 2024-01-12 DIAGNOSIS — M4316 Spondylolisthesis, lumbar region: Secondary | ICD-10-CM | POA: Diagnosis not present

## 2024-01-12 DIAGNOSIS — M549 Dorsalgia, unspecified: Secondary | ICD-10-CM | POA: Diagnosis present

## 2024-01-12 LAB — POC URINE PREG, ED: Preg Test, Ur: NEGATIVE

## 2024-01-12 MED ORDER — METHOCARBAMOL 500 MG PO TABS: 500.0000 mg | ORAL_TABLET | Freq: Two times a day (BID) | ORAL | 0 refills | Status: AC

## 2024-01-12 MED ORDER — OXYCODONE HCL 5 MG PO TABS
5.0000 mg | ORAL_TABLET | Freq: Once | ORAL | Status: AC
  Administered 2024-01-12: 5 mg via ORAL
  Filled 2024-01-12: qty 1

## 2024-01-12 NOTE — ED Provider Triage Note (Signed)
 Emergency Medicine Provider Triage Evaluation Note  Kathryn Mcneil , a 43 y.o. female  was evaluated in triage.  Pt complains of back pain.  Patient states she had acute onset of back pain when walking in the store yesterday.  Appreciates pain however denies weakness in lower extremities.  Denies bowel/bladder symptoms.  Denies groin numbness.  Patient denies significant orthopedic history regarding back.  Review of Systems  Positive: Back pain Negative: , Chills, chest pain, shortness of breath, abdominal pain, nausea, vomiting, weakness, facial droop, slurred speech, visual disturbances, unilateral weakness  Physical Exam  BP 117/69   Pulse (!) 105   Temp 98.1 F (36.7 C)   Resp (!) 26   Ht 5' 4 (1.626 m)   Wt 55.3 kg   LMP 12/09/2023 (Approximate)   SpO2 97%   BMI 20.94 kg/m  Gen:   Awake, no distress, patient visibly in discomfort leaning on her side to avoid putting pressure on her back Resp:  Normal effort  MSK:   Full strength in bilateral lower extremities, bilateral lower extremities neurovascularly intact, lumbar spine tender to palpation Other:    Medical Decision Making  Medically screening exam initiated at 7:08 PM.  Appropriate orders placed.  Kathryn Mcneil was informed that the remainder of the evaluation will be completed by another provider, this initial triage assessment does not replace that evaluation, and the importance of remaining in the ED until their evaluation is complete.  Orders: Point of care urine pregnancy, CT lumbar spine, oxycodone    Janetta Terrall FALCON, PA-C 01/13/24 0025

## 2024-01-12 NOTE — ED Triage Notes (Signed)
 C/O lower back pain since 7pm last night. Denies urinary issues. C/O nausea

## 2024-01-12 NOTE — ED Provider Notes (Signed)
 [place] EMERGENCY DEPARTMENT AT [facility] Provider Note   CSN: [id number] Arrival date & time: 01/12/24  1756     Patient presents with: Back Pain   Kathryn Mcneil is a 43 y.o. female.  Patient with past history significant for bipolar disorder, anxiety, and Presier disease present to the emergency department with concerns of back pain.  Reports that she has had severe lower back pain starting around 7 PM last night.  No reported injury or strain as far she can recall.  Denies IV drug use or prolonged steroid use.  No reported fever, chills, body aches, or weight loss.  She does endorse mild nausea but denies bowel or bladder incontinence, or saddle paraesthesia.   Back Pain      Prior to Admission medications   Medication Sig Start Date End Date Taking? Authorizing Provider  methocarbamol  (ROBAXIN ) 500 MG tablet Take 1 tablet (500 mg total) by mouth 2 (two) times daily. 01/12/24  Yes Abdulkareem Badolato A, PA-C  benzonatate  (TESSALON ) 100 MG capsule Take 1 capsule (100 mg total) by mouth every 8 (eight) hours. 10/11/22   Franklyn Sid SAILOR, MD  budesonide -formoterol  (SYMBICORT ) 80-4.5 MCG/ACT inhaler Inhale 2 puffs into the lungs 2 (two) times daily as needed. 09/15/22   LampteyAleene KIDD, MD  famotidine  (PEPCID ) 20 MG tablet Take 1 tablet (20 mg total) by mouth 2 (two) times daily. 10/11/22   Franklyn Sid SAILOR, MD  medroxyPROGESTERone  (PROVERA ) 10 MG tablet Take 1 tablet (10 mg total) by mouth daily. 12/18/22   Doretha Folks, MD  predniSONE  (DELTASONE ) 20 MG tablet Take 2 tablets (40 mg total) by mouth daily with breakfast. For the next four days 10/07/22   Garrick Charleston, MD    Allergies: Patient has no known allergies.    Review of Systems  Musculoskeletal:  Positive for back pain.  All other systems reviewed and are negative.   Updated Vital Signs BP 117/69   Pulse (!) 105   Temp 98.1 F (36.7 C)   Resp 18   Ht 5' 4 (1.626 m)   Wt 55.3 kg   LMP 12/09/2023  (Approximate)   SpO2 97%   BMI 20.94 kg/m   Physical Exam Vitals and nursing note reviewed.  Constitutional:      General: She is not in acute distress.    Appearance: She is well-developed.  HENT:     Head: Normocephalic and atraumatic.  Eyes:     Conjunctiva/sclera: Conjunctivae normal.  Cardiovascular:     Rate and Rhythm: Normal rate and regular rhythm.     Heart sounds: No murmur heard. Pulmonary:     Effort: Pulmonary effort is normal. No respiratory distress.     Breath sounds: Normal breath sounds.  Abdominal:     Palpations: Abdomen is soft.     Tenderness: There is no abdominal tenderness.  Musculoskeletal:        General: Tenderness present. No swelling, deformity or signs of injury.       Arms:     Cervical back: Neck supple.     Comments: TTP along the midline and paraspinal regions of the lower lumbar spine. Negative leg raise bilaterally.  Skin:    General: Skin is warm and dry.     Capillary Refill: Capillary refill takes less than 2 seconds.  Neurological:     Mental Status: She is alert.  Psychiatric:        Mood and Affect: Mood normal.     (all labs  ordered are listed, but only abnormal results are displayed) Labs Reviewed  POC URINE PREG, ED    EKG: None  Radiology: CT Lumbar Spine Wo Contrast Result Date: 01/12/2024 CLINICAL DATA:  Sudden onset low back pain for 2 days, initial encounter EXAM: CT LUMBAR SPINE WITHOUT CONTRAST TECHNIQUE: Multidetector CT imaging of the lumbar spine was performed without intravenous contrast administration. Multiplanar CT image reconstructions were also generated. RADIATION DOSE REDUCTION: This exam was performed according to the departmental dose-optimization program which includes automated exposure control, adjustment of the mA and/or kV according to patient size and/or use of iterative reconstruction technique. COMPARISON:  None Available. FINDINGS: Segmentation: 5 lumbar type vertebral bodies are well  visualized. Alignment: Alignment is well maintained with the exception of mild degenerative anterolisthesis of L4 on L5. No pars defects are seen. Vertebrae: Vertebral body height is well maintained. No compression deformity is noted. Paraspinal and other soft tissues: Paraspinal soft tissues are within normal limits. Disc levels: Very mild disc bulging in a generalized fashion is noted at L4-L5 somewhat accentuated by the mild anterolisthesis. No other disc pathology is seen. No neural foraminal impingement is noted. IMPRESSION: Mild degenerative changes with anterolisthesis of L4 on L5 with mild associated generalized disc bulging. No other focal abnormality noted. Electronically Signed   By: Oneil Devonshire M.D.   On: 01/12/2024 19:52     Procedures   Medications Ordered in the ED  oxyCODONE  (Oxy IR/ROXICODONE ) immediate release tablet 5 mg (5 mg Oral Given 01/12/24 1916)                                    Medical Decision Making Risk Prescription drug management.   This patient presents to the ED for concern of back pain.  Differential diagnosis includes disc herniation, cauda equina syndrome, lumbar radiculopathy, piriformis syndrome   Lab Tests:  I Ordered, and personally interpreted labs.  The pertinent results include: Urine pregnancy negative   Imaging Studies ordered:  I ordered imaging studies including CT lumbar spine I independently visualized and interpreted imaging which showed mild degenerative changes with anterolisthesis of L4 on L5 with mild associated generalized disc bulging. No other focal abnormality noted. I agree with the radiologist interpretation   Medicines ordered and prescription drug management:  I ordered medication including Percocet for pain Reevaluation of the patient after these medicines showed that the patient improved I have reviewed the patients home medicines and have made adjustments as needed   Problem List / ED Course:  Patient  presents to the ED with concerns of low back pain.  Past history significant for bipolar disorder, anxiety, Presier disease.  She states that she began experience sudden onset low back pain around 7 PM last night.  Denies any recent injury or straining as far she can recall.  No reported saddle paresthesia, bowel or bladder incontinence, or unilateral radicular symptoms.  She says she typically notes the pain worsen when she attempts to walk. Physical exam reveals midline and paraspinal tenderness of the lower lumbar spine.  Negative straight leg raise bilaterally.  Suspect likely disc herniation due to some level of strain.  CT lumbar spine ordered from triage for evaluation. CT lumbar reveals mild degeneration and anterolisthesis at L4-L5 with mild generalized disc bulge in this area.  Suspect likely cause of pain is from this bulging disc.  With no red flag symptoms, do feel the patient requires emergent MRI  or neurosurgery consultation.  He does report improvement with Percocet.  Will discharge home with Robaxin  and instructions to continue using ibuprofen at home.  She is otherwise stable this time for outpatient follow-up and discharge home.   Social Determinants of Health:  None  Final diagnoses:  Lumbar disc herniation    ED Discharge Orders          Ordered    methocarbamol  (ROBAXIN ) 500 MG tablet  2 times daily        01/12/24 2152               Remi Lopata A, PA-C 01/12/24 2249    Pamella Ozell LABOR, DO 01/20/24 1112

## 2024-01-12 NOTE — ED Triage Notes (Signed)
 Patient c/o intermittent stabbing lower back pain that makes her drop to the floor and reports that she was just walking around the store and it started hurting.

## 2024-01-12 NOTE — Discharge Instructions (Addendum)
 You were seen today for concerns of back pain.  You are found to have a disc herniation at your L4-L5 level.  This is likely due to some amount of strain that has occurred recently that caused the herniation to develop.  These typically will get reabsorbed on their own but they are painful in the meantime as a typically cause pressure on nerves.  You should continue take ibuprofen for the next several weeks to help manage this pain.  I am starting on a muscle relaxer for helps with your pain.  Return to emergency department for any concerns of new or worsening symptoms.  Otherwise, please follow-up with your primary care provider follow-up with neurosurgery.

## 2024-05-29 ENCOUNTER — Ambulatory Visit (HOSPITAL_COMMUNITY): Admission: EM | Admit: 2024-05-29 | Discharge: 2024-05-29 | Disposition: A | Discharge status: Home / Self Care

## 2024-05-29 NOTE — ED Notes (Signed)
 Attempted to call patient in lobby. No response
